# Patient Record
Sex: Female | Born: 1950 | Race: White | Hispanic: No | Smoking: Never smoker
Health system: Southern US, Community
[De-identification: ages and names within clinical notes are randomized; demographics above are authoritative.]

## PROBLEM LIST (undated history)

## (undated) DIAGNOSIS — E039 Hypothyroidism, unspecified: Secondary | ICD-10-CM

## (undated) DIAGNOSIS — K219 Gastro-esophageal reflux disease without esophagitis: Secondary | ICD-10-CM

## (undated) DIAGNOSIS — S129XXA Fracture of neck, unspecified, initial encounter: Secondary | ICD-10-CM

## (undated) DIAGNOSIS — F329 Major depressive disorder, single episode, unspecified: Secondary | ICD-10-CM

## (undated) DIAGNOSIS — S2220XA Unspecified fracture of sternum, initial encounter for closed fracture: Secondary | ICD-10-CM

## (undated) DIAGNOSIS — R0600 Dyspnea, unspecified: Secondary | ICD-10-CM

## (undated) DIAGNOSIS — S46119A Strain of muscle, fascia and tendon of long head of biceps, unspecified arm, initial encounter: Secondary | ICD-10-CM

## (undated) DIAGNOSIS — S22009A Unspecified fracture of unspecified thoracic vertebra, initial encounter for closed fracture: Secondary | ICD-10-CM

## (undated) DIAGNOSIS — R011 Cardiac murmur, unspecified: Secondary | ICD-10-CM

## (undated) DIAGNOSIS — D649 Anemia, unspecified: Secondary | ICD-10-CM

## (undated) DIAGNOSIS — Z85828 Personal history of other malignant neoplasm of skin: Secondary | ICD-10-CM

## (undated) DIAGNOSIS — S46319A Strain of muscle, fascia and tendon of triceps, unspecified arm, initial encounter: Secondary | ICD-10-CM

## (undated) DIAGNOSIS — F32A Depression, unspecified: Secondary | ICD-10-CM

## (undated) DIAGNOSIS — E079 Disorder of thyroid, unspecified: Secondary | ICD-10-CM

## (undated) DIAGNOSIS — H269 Unspecified cataract: Secondary | ICD-10-CM

## (undated) DIAGNOSIS — N189 Chronic kidney disease, unspecified: Secondary | ICD-10-CM

## (undated) DIAGNOSIS — M1990 Unspecified osteoarthritis, unspecified site: Secondary | ICD-10-CM

## (undated) HISTORY — DX: Depression, unspecified: F32.A

## (undated) HISTORY — PX: OTHER SURGICAL HISTORY: SHX169

## (undated) HISTORY — DX: Personal history of other malignant neoplasm of skin: Z85.828

## (undated) HISTORY — DX: Unspecified fracture of unspecified thoracic vertebra, initial encounter for closed fracture: S22.009A

## (undated) HISTORY — PX: FRACTURE SURGERY: SHX138

## (undated) HISTORY — DX: Cardiac murmur, unspecified: R01.1

## (undated) HISTORY — DX: Gastro-esophageal reflux disease without esophagitis: K21.9

## (undated) HISTORY — DX: Major depressive disorder, single episode, unspecified: F32.9

## (undated) HISTORY — DX: Unspecified fracture of sternum, initial encounter for closed fracture: S22.20XA

## (undated) HISTORY — PX: COLONOSCOPY: SHX174

## (undated) HISTORY — DX: Strain of muscle, fascia and tendon of long head of biceps, unspecified arm, initial encounter: S46.119A

## (undated) HISTORY — DX: Disorder of thyroid, unspecified: E07.9

## (undated) HISTORY — PX: JOINT REPLACEMENT: SHX530

## (undated) HISTORY — DX: Fracture of neck, unspecified, initial encounter: S12.9XXA

## (undated) HISTORY — DX: Strain of muscle, fascia and tendon of triceps, unspecified arm, initial encounter: S46.319A

## (undated) HISTORY — PX: ANTERIOR CRUCIATE LIGAMENT REPAIR: SHX115

## (undated) HISTORY — DX: Unspecified cataract: H26.9

## (undated) HISTORY — DX: Chronic kidney disease, unspecified: N18.9

---

## 1983-07-22 HISTORY — PX: FOOT SURGERY: SHX648

## 2004-05-09 ENCOUNTER — Other Ambulatory Visit: Admission: RE | Admit: 2004-05-09 | Discharge: 2004-05-09 | Payer: Self-pay | Admitting: Family Medicine

## 2004-07-21 DIAGNOSIS — S46319A Strain of muscle, fascia and tendon of triceps, unspecified arm, initial encounter: Secondary | ICD-10-CM

## 2004-07-21 DIAGNOSIS — S22009A Unspecified fracture of unspecified thoracic vertebra, initial encounter for closed fracture: Secondary | ICD-10-CM

## 2004-07-21 DIAGNOSIS — S2220XA Unspecified fracture of sternum, initial encounter for closed fracture: Secondary | ICD-10-CM

## 2004-07-21 HISTORY — DX: Unspecified fracture of sternum, initial encounter for closed fracture: S22.20XA

## 2004-07-21 HISTORY — DX: Strain of muscle, fascia and tendon of triceps, unspecified arm, initial encounter: S46.319A

## 2004-07-21 HISTORY — DX: Unspecified fracture of unspecified thoracic vertebra, initial encounter for closed fracture: S22.009A

## 2007-01-20 ENCOUNTER — Encounter: Admission: RE | Admit: 2007-01-20 | Discharge: 2007-01-20 | Payer: Self-pay | Admitting: Gastroenterology

## 2007-03-03 ENCOUNTER — Ambulatory Visit (HOSPITAL_COMMUNITY): Admission: RE | Admit: 2007-03-03 | Discharge: 2007-03-03 | Payer: Self-pay | Admitting: Gastroenterology

## 2007-03-03 ENCOUNTER — Encounter (INDEPENDENT_AMBULATORY_CARE_PROVIDER_SITE_OTHER): Payer: Self-pay | Admitting: Gastroenterology

## 2008-10-05 ENCOUNTER — Encounter: Admission: RE | Admit: 2008-10-05 | Discharge: 2008-10-05 | Payer: Self-pay | Admitting: Family Medicine

## 2010-12-03 NOTE — Op Note (Signed)
NAMEDAMITA, EPPARD                ACCOUNT NO.:  [id number]   MEDICAL RECORD NO.:  [id number]          PATIENT TYPE:  AMB   LOCATION:  ENDO                         FACILITY:  [facility]   PHYSICIAN:  Anselmo Rod, M.D.  DATE OF BIRTH:  1951/02/14   DATE OF PROCEDURE:  03/03/2007  DATE OF DISCHARGE:                               OPERATIVE REPORT   PROCEDURE PERFORMED:  Screening colonoscopy.   ENDOSCOPIST:  Anselmo Rod, M.D.   INSTRUMENT USED:  Pentax video colonoscope.   INDICATIONS FOR PROCEDURE:  A 60 year old white female undergoing  screening colonoscopy to rule out colonic polyps, masses, etc.   PREPROCEDURE PREPARATION:  Informed consent was procured from the  patient.  The patient was fasted for eight hours prior to the procedure  and prepped with a bottle of magnesium citrate and a gallon of NuLytely  the night prior to the procedure.  The risks and benefits of the  procedure including a 10% miss rate for cancer or polyps was discussed  with the patient as well.   PREPROCEDURE PHYSICAL:  The patient had stable vital signs.  Neck  supple.  Chest clear to auscultation.  S1 and S2 regular.  Abdomen soft  with normal bowel sounds.   DESCRIPTION OF PROCEDURE:  The patient was placed in left lateral  decubitus position and sedated with an additional 25 mcg of Fentanyl and  2 mg of Versed given intravenously in slow incremental doses. Once the  patient was adequately sedated and maintained on low flow oxygen and  continuous cardiac monitoring, the Pentax video colonoscope was advanced  from the rectum to the cecum.  The appendicular orifice and ileocecal  valve were clearly visualized and photographed.  Multiple washes were  done.  Terminal ileum appeared healthy without lesions, no masses,  polyps, erosions, ulcerations were seen.  A few early sigmoid  diverticula were noted.  The rest of the exam was unremarkable.  Retroflexion in the rectum revealed no abnormalities. The  patient  tolerated the procedure well without any complication.   IMPRESSION:  Normal colonoscopy up to the terminal ileum except for a  couple of early sigmoid diverticula.   RECOMMENDATIONS:  1. Continue a high fiber diet with liberal fluid intake.  2. Repeat colonoscopy in the next 10 years unless the patient has any      abnormal symptoms in the interim in which case, further      recommendations will be made.  3. Outpatient followup in the next two weeks for further      recommendations.      Anselmo Rod, M.D.  Electronically Signed     JNM/MEDQ  D:  03/03/2007  T:  03/04/2007  Job:  [id number]   cc:   Magnus Sinning. Rice, M.D.  Fax: [phone]   Chester Holstein. Earlene Plater, M.D.  Fax: [phone]

## 2010-12-03 NOTE — Op Note (Signed)
Kimberly [name], MAXFIELD                ACCOUNT NO.:  [id number]   MEDICAL RECORD NO.:  [id number]          PATIENT TYPE:  AMB   LOCATION:  ENDO                         FACILITY:  [facility]   PHYSICIAN:  Anselmo Rod, M.D.  DATE OF BIRTH:  June 21, 1951   DATE OF PROCEDURE:  03/03/2007  DATE OF DISCHARGE:                               OPERATIVE REPORT   PROCEDURE PERFORMED:  Esophagogastroduodenoscopy with multiple cold  biopsies.   ENDOSCOPIST:  Anselmo Rod, M.D.   INSTRUMENT USED:  Pentax video panendoscope.   INDICATIONS FOR PROCEDURE:  A 60 year old white female with history of  dysphagia undergoing EGD to rule out peptic esophagitis, stricture, etc.   PREPROCEDURE PREPARATION:  Informed consent was procured from the  patient.  The patient fasted for 8 hours prior to the procedure. Risks  and benefits of the procedure were discussed with the patient in great  detail.   PREPROCEDURE PHYSICAL:  The patient had stable vital signs. Neck supple.  Chest clear to auscultation. S1, S2 regular. Abdomen soft with normal  bowel sounds.   DESCRIPTION OF PROCEDURE:  The patient was placed in the left lateral  decubitus position and sedated with 75 mcg of Fentanyl and 8 mg of  Versed given intravenously in slow incremental doses. Once the patient  was adequately sedated and maintained on low-flow oxygen and continuous  cardiac monitoring, the Pentax video panendoscope was advanced through  the mouthpiece over the tongue into the esophagus under direct vision.  The entire esophagus was widely patent with no evidence of ring,  stricture, masses or esophagitis.  Small patches of pinkish mucosa above  the Z-line were biopsied.  Only two biopsies were done as there was  significant bleeding from the biopsy sites. The Z-line appeared healthy.  The scope was then advanced into the stomach.  Three small sessile  polyps were noted. A couple of these were biopsied for pathology. These  polyps were  predominantly in the high cardia, the rest of the gastric  mucosa appeared healthy except for a small antral erosion.  The proximal  small bowel appeared normal.  There was no obstruction. The patient  tolerated the procedure well without immediate complications.  There was  no evidence of a hiatal hernia on retroflexion or antegrade inspection.   IMPRESSION:  1. Two small patches of pinkish mucosa biopsied above the Z-line to      rule out Barrett's.  2. Otherwise healthy appearing esophagus with widely patent lumen, no      stricture, esophagitis or other masses noted.  3. Three small sessile polyps noted in the high cardia, biopsies done      for pathology.  4. Single antral erosions appreciated.  5. Normal proximal small bowel.  6. No evidence of a hiatal hernia.   RECOMMENDATIONS:  1. Await pathology results.  2. Avoid all nonsteroidals including aspirin for the next 4 weeks.  3. Proceed with a colonoscopy at this time.  4. Further recommendations to be made after colonoscopy has been done.      Anselmo Rod, M.D.  Electronically Signed  JNM/MEDQ  D:  03/03/2007  T:  03/04/2007  Job:  [id number]   cc:   Gerri Spore B. Earlene Plater, M.D.  Fax: [phone]   Magnus Sinning. Rice, M.D.  Fax: [phone]

## 2013-01-05 ENCOUNTER — Encounter: Payer: Self-pay | Admitting: Nurse Practitioner

## 2013-01-05 ENCOUNTER — Ambulatory Visit (INDEPENDENT_AMBULATORY_CARE_PROVIDER_SITE_OTHER): Admitting: Nurse Practitioner

## 2013-01-05 VITALS — BP 102/82 | Temp 97.5°F | Ht 64.0 in | Wt 180.0 lb

## 2013-01-05 DIAGNOSIS — F339 Major depressive disorder, recurrent, unspecified: Secondary | ICD-10-CM | POA: Insufficient documentation

## 2013-01-05 DIAGNOSIS — M79609 Pain in unspecified limb: Secondary | ICD-10-CM

## 2013-01-05 DIAGNOSIS — M79645 Pain in left finger(s): Secondary | ICD-10-CM

## 2013-01-05 DIAGNOSIS — L989 Disorder of the skin and subcutaneous tissue, unspecified: Secondary | ICD-10-CM

## 2013-01-05 NOTE — Patient Instructions (Signed)

## 2013-01-05 NOTE — Progress Notes (Signed)
  Subjective:    Patient ID: SAYDE LISH, female    DOB: [birth year], 62 y.o.   MRN: [id number]  HPI1. Patient here today c/o pain in left first knuckle- swells at times and hurts when she uses her fingers a lot. 2. Facial lesion hsa been there for awhile- not sure if she wants to see derm or not.   Review of Systems  Musculoskeletal: Positive for joint swelling.  All other systems reviewed and are negative.       Objective:   Physical Exam  Constitutional: She appears well-developed and well-nourished.  Cardiovascular: Normal rate and normal heart sounds.   Pulmonary/Chest: Effort normal and breath sounds normal.  Musculoskeletal:  From of left index finger with pain on full flexion- mild edema at distal metacarpal head.  Skin:  2cm annular macular lesion right cheek     BP 102/82  Temp(Src) 97.5 F (36.4 C) (Oral)  Ht 5\' 4"  (1.626 m)  Wt 180 lb (81.647 kg)  BMI 30.88 kg/m2      Assessment & Plan:  1. Finger pain, left Extra strenghth tylenol OTC Soak in moist heat when flares - Arthritis Panel  2. Facial lesion Patient refeuses derm referrall =will call if changes her mind  Mary-Margaret Daphine Deutscher, FNP

## 2013-01-06 LAB — ARTHRITIS PANEL
Anti Nuclear Antibody(ANA): NEGATIVE
Rheumatoid fact SerPl-aCnc: 10 [IU]/mL (ref <=14)
Sed Rate: 7 mm/hr (ref 0–22)
Uric Acid, Serum: 5.1 mg/dL (ref 2.4–6.0)

## 2013-04-18 ENCOUNTER — Ambulatory Visit (INDEPENDENT_AMBULATORY_CARE_PROVIDER_SITE_OTHER): Admitting: Family Medicine

## 2013-04-18 ENCOUNTER — Encounter: Payer: Self-pay | Admitting: Family Medicine

## 2013-04-18 VITALS — BP 92/60 | HR 65 | Temp 97.0°F | Ht 64.0 in | Wt 172.0 lb

## 2013-04-18 DIAGNOSIS — M26629 Arthralgia of temporomandibular joint, unspecified side: Secondary | ICD-10-CM

## 2013-04-18 MED ORDER — OMEPRAZOLE 20 MG PO CPDR: 20.0000 mg | DELAYED_RELEASE_CAPSULE | Freq: Every day | ORAL | Status: DC

## 2013-04-18 MED ORDER — NAPROXEN 500 MG PO TABS: 500.0000 mg | ORAL_TABLET | Freq: Two times a day (BID) | ORAL | Status: DC

## 2013-04-18 NOTE — Progress Notes (Signed)
  Subjective:    Patient ID: Kimberly [name], female    DOB: Sep 01, 1950, 62 y.o.   MRN: [id number]  HPI This 62 y.o. female presents for evaluation of jaw discomfort for a week.  She has  A hx of this and has been to see her dentist who has rx'd her a bite block but it didn't help. She states she has been taking motrin otc as directed.   Review of Systems C/o left tmj   No chest pain, SOB, HA, dizziness, vision change, N/V, diarrhea, constipation, dysuria, urinary urgency or frequency, myalgias, arthralgias or rash.  Objective:   Physical Exam Vital signs noted  Well developed well nourished female.  HEENT - Head atraumatic Normocephalic                Eyes - PERRLA, Conjuctiva - clear Sclera- Clear EOMI                Ears - EAC's Wnl TM's Wnl Gross Hearing WNL                Nose - Nares patent                 Throat - oropharanx wnl Respiratory - Lungs CTA bilateral Cardiac - RRR S1 and S2 without murmur MS - TTP left TMJ with crepitus.       Assessment & Plan:  TMJ arthralgia - Plan: naproxen (NAPROSYN) 500 MG tablet, omeprazole (PRILOSEC) 20 MG capsule Recommend using bite block given by dentist and if not better then follow up.  GERD - Prilosec 20mg  one po qd #30w/3

## 2013-04-18 NOTE — Patient Instructions (Addendum)
Temporomandibular Joint Pain  Your exam shows that you have a problem with your temporomandibular joint (TMJ), the joint that moves when you open your mouth or chew food. TMJ problems can result from direct injuries, bite abnormalities, or tension states which cause you to grind or clench your teeth. Typical symptoms include pain around the joint, clicking, restricted movement, and headaches.  The TMJ is like any other joint in the body; when it is strained, it needs rest to repair itself. To keep the joint at rest it is important that you do not open your mouth wider than the width of your index finger. If you must yawn, be sure to support your chin with your hand so your mouth does not open wide. Eat a soft diet (nothing firmer than ground beef, no raw vegetables), do not chew gum and do not talk if it causes you pain.  Apply topical heat by using a warm, moist cloth placed in front of the ear for 15 to 20 minutes several times daily. Alternating heat and ice may give even more relief. Anti-inflammatory pain medicine and muscle relaxants can also be helpful. A dental orthotic or splint may be used for temporary relief. Long-term problems may require treatment for stress as well as braces or surgery. Please check with your doctor or dentist if your symptoms do not improve within one week.  Document Released: 08/14/2004 Document Revised: 09/29/2011 Document Reviewed: 07/07/2005  [employer] Patient Information 2014 [employer]

## 2013-05-15 ENCOUNTER — Other Ambulatory Visit: Payer: Self-pay | Admitting: Family Medicine

## 2013-10-27 ENCOUNTER — Ambulatory Visit (INDEPENDENT_AMBULATORY_CARE_PROVIDER_SITE_OTHER): Admitting: Family Medicine

## 2013-10-27 ENCOUNTER — Ambulatory Visit (INDEPENDENT_AMBULATORY_CARE_PROVIDER_SITE_OTHER)

## 2013-10-27 VITALS — HR 84 | Temp 99.7°F | Ht 64.0 in | Wt 169.0 lb

## 2013-10-27 DIAGNOSIS — R059 Cough, unspecified: Secondary | ICD-10-CM

## 2013-10-27 DIAGNOSIS — J209 Acute bronchitis, unspecified: Secondary | ICD-10-CM

## 2013-10-27 DIAGNOSIS — R634 Abnormal weight loss: Secondary | ICD-10-CM

## 2013-10-27 DIAGNOSIS — R509 Fever, unspecified: Secondary | ICD-10-CM

## 2013-10-27 DIAGNOSIS — R05 Cough: Secondary | ICD-10-CM

## 2013-10-27 DIAGNOSIS — J029 Acute pharyngitis, unspecified: Secondary | ICD-10-CM

## 2013-10-27 DIAGNOSIS — R5381 Other malaise: Secondary | ICD-10-CM

## 2013-10-27 DIAGNOSIS — R5383 Other fatigue: Secondary | ICD-10-CM

## 2013-10-27 LAB — POCT CBC
Granulocyte percent: 70.7 %G (ref 37–80)
HCT, POC: 38.6 % (ref 37.7–47.9)
Hemoglobin: 12.7 g/dL (ref 12.2–16.2)
Lymph, poc: 1.4 (ref 0.6–3.4)
MCH, POC: 29.1 pg (ref 27–31.2)
MCHC: 32.8 g/dL (ref 31.8–35.4)
MCV: 88.9 fL (ref 80–97)
MPV: 7.2 fL (ref 0–99.8)
POC Granulocyte: 4.5 (ref 2–6.9)
POC LYMPH PERCENT: 22.1 %L (ref 10–50)
Platelet Count, POC: 163 10*3/uL (ref 142–424)
RBC: 4.4 M/uL (ref 4.04–5.48)
RDW, POC: 13.8 %
WBC: 6.4 10*3/uL (ref 4.6–10.2)

## 2013-10-27 LAB — POCT RAPID STREP A (OFFICE): Rapid Strep A Screen: POSITIVE — AB

## 2013-10-27 LAB — POCT INFLUENZA A/B
Influenza A, POC: NEGATIVE
Influenza B, POC: NEGATIVE

## 2013-10-27 MED ORDER — LEVALBUTEROL HCL 1.25 MG/3ML IN NEBU
1.2500 mg | INHALATION_SOLUTION | Freq: Once | RESPIRATORY_TRACT | Status: AC
  Administered 2013-10-27: 1.25 mg via RESPIRATORY_TRACT

## 2013-10-27 MED ORDER — HYDROCODONE-HOMATROPINE 5-1.5 MG/5ML PO SYRP: 5.0000 mL | ORAL_SOLUTION | Freq: Three times a day (TID) | ORAL | Status: DC | PRN

## 2013-10-27 MED ORDER — LEVOFLOXACIN 500 MG PO TABS: 500.0000 mg | ORAL_TABLET | Freq: Every day | ORAL | Status: DC

## 2013-10-27 MED ORDER — LEVALBUTEROL HCL 1.25 MG/0.5ML IN NEBU: 1.2500 mg | INHALATION_SOLUTION | Freq: Once | RESPIRATORY_TRACT | Status: DC

## 2013-10-27 MED ORDER — AMOXICILLIN-POT CLAVULANATE 875-125 MG PO TABS: 1.0000 | ORAL_TABLET | Freq: Two times a day (BID) | ORAL | Status: DC

## 2013-10-27 MED ORDER — ALBUTEROL SULFATE HFA 108 (90 BASE) MCG/ACT IN AERS: 2.0000 | INHALATION_SPRAY | Freq: Four times a day (QID) | RESPIRATORY_TRACT | Status: DC | PRN

## 2013-10-27 MED ORDER — METHYLPREDNISOLONE (PAK) 4 MG PO TABS: ORAL_TABLET | ORAL | Status: DC

## 2013-10-27 NOTE — Progress Notes (Signed)
Subjective:    Patient ID: Kimberly [name], female    DOB: 12-Aug-1950, 63 y.o.   MRN: [id number]  HPI This 63 y.o. female presents for evaluation of weakness, fatigue, URI, and cough.  She states  She never gets sick and she never goes to the doctor.  She c/o congestion in her central chest. She is not a smoker.  She has been coughing a lot and wheezing. She is sleeping a lot.  She was talking to her son who told her to go to the doctor.  She has been having weight loss, fever, malaise, Cough.   Review of Systems No chest pain, SOB, HA, dizziness, vision change, N/V, diarrhea, constipation, dysuria, urinary urgency or frequency, myalgias, arthralgias or rash.     Objective:   Physical Exam  Vital signs noted  Pale appearing female in NAD.  HEENT - Head atraumatic Normocephalic                Eyes - PERRLA, Conjuctiva - clear Sclera- Clear EOMI                Ears - EAC's Wnl TM's Wnl Gross Hearing WNL                Throat - oropharanx wnl Respiratory - Lungs with expiratory wheezes scattered Cardiac - RRR S1 and S2 without murmur GI - Abdomen soft Nontender and bowel sounds active x 4 Extremities - No edema. Neuro - Grossly intact.  Chest Xray - No infiltrates Lysbeth Penner FNP    Results for orders placed in visit on 10/27/13  POCT INFLUENZA A/B      Result Value Ref Range   Influenza A, POC Negative     Influenza B, POC Negative    POCT RAPID STREP A (OFFICE)      Result Value Ref Range   Rapid Strep A Screen Positive (*) Negative  POCT CBC      Result Value Ref Range   WBC 6.4  4.6 - 10.2 K/uL   Lymph, poc 1.4  0.6 - 3.4   POC LYMPH PERCENT 22.1  10 - 50 %L   POC Granulocyte 4.5  2 - 6.9   Granulocyte percent 70.7  37 - 80 %G   RBC 4.4  4.04 - 5.48 M/uL   Hemoglobin 12.7  12.2 - 16.2 g/dL   HCT, POC 38.6  37.7 - 47.9 %   MCV 88.9  80 - 97 fL   MCH, POC 29.1  27 - 31.2 pg   MCHC 32.8  31.8 - 35.4 g/dL   RDW, POC 13.8     Platelet Count, POC 163.0  142 -  424 K/uL   MPV 7.2  0 - 99.8 fL   Assessment & Plan:  Fever - Plan: POCT Influenza A/B, POCT rapid strep A, POCT CBC, DG Chest 2 View, POCT CBC, Sedimentation rate, methylPREDNIsolone (MEDROL DOSPACK) 4 MG tablet, levalbuterol (XOPENEX) nebulizer solution 1.25 mg, amoxicillin-clavulanate (AUGMENTIN) 875-125 MG per tablet, albuterol (PROVENTIL HFA;VENTOLIN HFA) 108 (90 BASE) MCG/ACT inhaler, DISCONTINUED: levofloxacin (LEVAQUIN) 500 MG tablet  Cough - Plan: POCT Influenza A/B, POCT rapid strep A, POCT CBC, DG Chest 2 View, POCT CBC, Sedimentation rate, methylPREDNIsolone (MEDROL DOSPACK) 4 MG tablet, levalbuterol (XOPENEX) nebulizer solution 1.25 mg, amoxicillin-clavulanate (AUGMENTIN) 875-125 MG per tablet, HYDROcodone-homatropine (HYCODAN) 5-1.5 MG/5ML syrup, DISCONTINUED: levofloxacin (LEVAQUIN) 500 MG tablet  Loss of weight - Plan: DG Chest 2 View, Sedimentation rate, methylPREDNIsolone (MEDROL DOSPACK) 4 MG tablet,  levalbuterol (XOPENEX) nebulizer solution 1.25 mg, amoxicillin-clavulanate (AUGMENTIN) 875-125 MG per tablet, DISCONTINUED: levofloxacin (LEVAQUIN) 500 MG tablet  Other malaise and fatigue - Plan: Vitamin B12, Vit D  25 hydroxy (rtn osteoporosis monitoring), Thyroid Panel With TSH, Sedimentation rate, methylPREDNIsolone (MEDROL DOSPACK) 4 MG tablet, levalbuterol (XOPENEX) nebulizer solution 1.25 mg, amoxicillin-clavulanate (AUGMENTIN) 875-125 MG per tablet, CANCELED: POCT SEDIMENTATION RATE, DISCONTINUED: levofloxacin (LEVAQUIN) 500 MG tablet  Acute bronchitis - Plan: methylPREDNIsolone (MEDROL DOSPACK) 4 MG tablet, levalbuterol (XOPENEX) nebulizer solution 1.25 mg, amoxicillin-clavulanate (AUGMENTIN) 875-125 MG per tablet, albuterol (PROVENTIL HFA;VENTOLIN HFA) 108 (90 BASE) MCG/ACT inhaler, HYDROcodone-homatropine (HYCODAN) 5-1.5 MG/5ML syrup, levalbuterol (XOPENEX) nebulizer solution 1.25 mg, DISCONTINUED: levofloxacin (LEVAQUIN) 500 MG tablet  Acute pharyngitis - Plan:  amoxicillin-clavulanate (AUGMENTIN) 875-125 MG per tablet po bid x 2 weeks #28  Follow up prn and follow up next week.  Lysbeth Penner FNP

## 2013-10-28 ENCOUNTER — Other Ambulatory Visit: Payer: Self-pay | Admitting: Family Medicine

## 2013-10-28 ENCOUNTER — Telehealth: Payer: Self-pay | Admitting: *Deleted

## 2013-10-28 LAB — THYROID PANEL WITH TSH
Free Thyroxine Index: 1.6 (ref 1.2–4.9)
T3 Uptake Ratio: 24 % (ref 24–39)
T4, Total: 6.8 ug/dL (ref 4.5–12.0)
TSH: 2.04 u[IU]/mL (ref 0.450–4.500)

## 2013-10-28 LAB — VITAMIN B12: Vitamin B-12: 1010 pg/mL — ABNORMAL HIGH (ref 211–946)

## 2013-10-28 LAB — SEDIMENTATION RATE: Sed Rate: 9 mm/hr (ref 0–40)

## 2013-10-28 LAB — VITAMIN D 25 HYDROXY (VIT D DEFICIENCY, FRACTURES): Vit D, 25-Hydroxy: 25.2 ng/mL — ABNORMAL LOW (ref 30.0–100.0)

## 2013-10-28 MED ORDER — VITAMIN D (ERGOCALCIFEROL) 1.25 MG (50000 UNIT) PO CAPS: 50000.0000 [IU] | ORAL_CAPSULE | ORAL | Status: DC

## 2013-10-28 NOTE — Telephone Encounter (Signed)
Message copied by Marin Olp on Fri Oct 28, 2013  1:38 PM ------      Message from: Lysbeth Penner      Created: Fri Oct 28, 2013 12:52 PM       Zigmund Daniel can you call her, I called her yesterday with the radiology interpretation, I did not mention a cd to pick up or know if we do this ------

## 2013-10-28 NOTE — Telephone Encounter (Signed)
Spoke with pt regarding CD Will have ready when pt comes to appt on Monday

## 2013-10-31 ENCOUNTER — Ambulatory Visit (INDEPENDENT_AMBULATORY_CARE_PROVIDER_SITE_OTHER): Admitting: Family Medicine

## 2013-10-31 ENCOUNTER — Encounter: Payer: Self-pay | Admitting: Family Medicine

## 2013-10-31 VITALS — BP 124/80 | HR 78 | Temp 97.1°F | Ht 64.0 in | Wt 168.8 lb

## 2013-10-31 DIAGNOSIS — R5383 Other fatigue: Secondary | ICD-10-CM

## 2013-10-31 DIAGNOSIS — R634 Abnormal weight loss: Secondary | ICD-10-CM

## 2013-10-31 DIAGNOSIS — J209 Acute bronchitis, unspecified: Secondary | ICD-10-CM

## 2013-10-31 DIAGNOSIS — R05 Cough: Secondary | ICD-10-CM

## 2013-10-31 DIAGNOSIS — R5381 Other malaise: Secondary | ICD-10-CM

## 2013-10-31 DIAGNOSIS — R059 Cough, unspecified: Secondary | ICD-10-CM

## 2013-10-31 DIAGNOSIS — R509 Fever, unspecified: Secondary | ICD-10-CM

## 2013-10-31 DIAGNOSIS — E559 Vitamin D deficiency, unspecified: Secondary | ICD-10-CM

## 2013-10-31 DIAGNOSIS — J029 Acute pharyngitis, unspecified: Secondary | ICD-10-CM

## 2013-10-31 MED ORDER — AMOXICILLIN-POT CLAVULANATE 875-125 MG PO TABS: 1.0000 | ORAL_TABLET | Freq: Two times a day (BID) | ORAL | Status: DC

## 2013-10-31 NOTE — Progress Notes (Signed)
   Subjective:    Patient ID: Kimberly [name], female    DOB: August 02, 1950, 63 y.o.   MRN: [id number]  HPI  This 63 y.o. female presents for evaluation of follow up on sore throat, weight loss, malaise And fatigue.  She has had labs cmp, cbc, tsh, sed rate, vit b12. Vit D, cxr, and influenza and Strep throat titres and the vit D was low, positive for gabs pharangitis, otherwise all other tests Were normal.  She was tx for bronchitis and strep throat and she has been giving some of the augmentin to her husband because he now has a uri and she would like a refill on the augmentin. She is doing a lot better now and feels better..  Review of Systems    No chest pain, SOB, HA, dizziness, vision change, N/V, diarrhea, constipation, dysuria, urinary urgency or frequency, myalgias, arthralgias or rash.  Objective:   Physical Exam  Vital signs noted  Well developed well nourished female.  HEENT - Head atraumatic Normocephalic                Eyes - PERRLA, Conjuctiva - clear Sclera- Clear EOMI                Ears - EAC's Wnl TM's Wnl Gross Hearing WNL                Nose - Nares patent                 Throat - oropharanx wnl Respiratory - Lungs CTA bilateral Cardiac - RRR S1 and S2 without murmur GI - Abdomen soft Nontender and bowel sounds active x 4 Extremities - No edema. Neuro - Grossly intact.      Assessment & Plan:  Fever - Plan: amoxicillin-clavulanate (AUGMENTIN) 875-125 MG per tablet - Resolved  Cough - Plan: amoxicillin-clavulanate (AUGMENTIN) 875-125 MG per tablet - Resolved  Loss of weight - Plan: amoxicillin-clavulanate (AUGMENTIN) 875-125 MG per tablet - Resolving  Other malaise and fatigue - Plan: amoxicillin-clavulanate (AUGMENTIN) 875-125 MG per tablet  Acute bronchitis - Plan: amoxicillin-clavulanate (AUGMENTIN) 875-125 MG per tablet - Resolving  Acute pharyngitis - Plan: amoxicillin-clavulanate (AUGMENTIN) 875-125 MG per tablet - Resolving  Unspecified vitamin  D deficiency - Plan: DG Bone Density And continue otc and rx meds  Discussed she get mammogram scheduled  Follow up prn and in 4 months  Lysbeth Penner FNP

## 2013-11-03 ENCOUNTER — Telehealth: Payer: Self-pay | Admitting: Family Medicine

## 2013-11-03 NOTE — Telephone Encounter (Signed)
I called [facility] and they do have RX. They said it was too early to fill until today. I  Notified pt and she verbalized understanding.

## 2013-12-21 ENCOUNTER — Ambulatory Visit (INDEPENDENT_AMBULATORY_CARE_PROVIDER_SITE_OTHER): Admitting: Pharmacist

## 2013-12-21 ENCOUNTER — Ambulatory Visit (INDEPENDENT_AMBULATORY_CARE_PROVIDER_SITE_OTHER)

## 2013-12-21 ENCOUNTER — Encounter: Payer: Self-pay | Admitting: Pharmacist

## 2013-12-21 VITALS — Ht 64.0 in | Wt 170.0 lb

## 2013-12-21 DIAGNOSIS — M8580 Other specified disorders of bone density and structure, unspecified site: Secondary | ICD-10-CM | POA: Insufficient documentation

## 2013-12-21 DIAGNOSIS — M899 Disorder of bone, unspecified: Secondary | ICD-10-CM

## 2013-12-21 DIAGNOSIS — E559 Vitamin D deficiency, unspecified: Secondary | ICD-10-CM

## 2013-12-21 DIAGNOSIS — M949 Disorder of cartilage, unspecified: Secondary | ICD-10-CM

## 2013-12-21 LAB — HM DEXA SCAN
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
## 2013-12-21 NOTE — Patient Instructions (Signed)
Exercise for Strong Bones  Exercise is important to build and maintain strong bones / bone density.  There are 2 types of exercises that are important to building and maintaining strong bones:  Weight- bearing and muscle-stregthening.  Weight-bearing Exercises  These exercises include activities that make you move against gravity while staying upright. Weight-bearing exercises can be high-impact or low-impact.  High-impact weight-bearing exercises help build bones and keep them strong. If you have broken a bone due to osteoporosis or are at risk of breaking a bone, you may need to avoid high-impact exercises. If you're not sure, you should check with your healthcare provider.  Examples of high-impact weight-bearing exercises are: Dancing  Doing high-impact aerobics  Hiking  Jogging/running  Jumping Rope  Stair climbing  Tennis  Low-impact weight-bearing exercises can also help keep bones strong and are a safe alternative if you cannot do high-impact exercises.   Examples of low-impact weight-bearing exercises are: Using elliptical training machines  Doing low-impact aerobics  Using stair-step machines  Fast walking on a treadmill or outside   Muscle-Strengthening Exercises These exercises include activities where you move your body, a weight or some other resistance against gravity. They are also known as resistance exercises and include: Lifting weights  Using elastic exercise bands  Using weight machines  Lifting your own body weight  Functional movements, such as standing and rising up on your toes  Yoga and Pilates can also improve strength, balance and flexibility. However, certain positions may not be safe for people with osteoporosis or those at increased risk of broken bones. For example, exercises that have you bend forward may increase the chance of breaking a bone in the spine.   Non-Impact Exercises There are other types of exercises that can help  prevent falls.  Non-impact exercises can help you to improve balance, posture and how well you move in everyday activities. Some of these exercises include: Balance exercises that strengthen your legs and test your balance, such as Tai Chi, can decrease your risk of falls.  Posture exercises that improve your posture and reduce rounded or "sloping" shoulders can help you decrease the chance of breaking a bone, especially in the spine.  Functional exercises that improve how well you move can help you with everyday activities and decrease your chance of falling and breaking a bone. For example, if you have trouble getting up from a chair or climbing stairs, you should do these activities as exercises.   **A physical therapist can teach you balance, posture and functional exercises. He/she can also help you learn which exercises are safe and appropriate for you.  Kimberly [name] has a physical therapy office in [place] in front of our office and referrals can be made for assessments and treatment as needed and strength and balance training.  If you would like to have an assessment with Kimberly [name] and our physical therapy team please let a nurse or provider know.      Fall Prevention and Home Safety Falls cause injuries and can affect all age groups. It is possible to prevent falls.  HOW TO PREVENT FALLS  Wear shoes with rubber soles that do not have an opening for your toes.  Keep the inside and outside of your house well lit.  Use night lights throughout your home.  Remove clutter from floors.  Clean up floor spills.  Remove throw rugs or fasten them to the floor with carpet tape.  Do not place electrical cords across pathways.  Put grab bars  by your tub, shower, and toilet. Do not use towel bars as grab bars.  Put handrails on both sides of the stairway. Fix loose handrails.  Do not climb on stools or stepladders, if possible.  Do not wax your floors.  Repair uneven or unsafe sidewalks,  walkways, or stairs.  Keep items you use a lot within reach.  Be aware of pets.  Keep emergency numbers next to the telephone.  Put smoke detectors in your home and near bedrooms. Ask your doctor what other things you can do to prevent falls. Document Released: 05/03/2009 Document Revised: 01/06/2012 Document Reviewed: 10/07/2011 [facility] Patient Information 2014 [place], [place].

## 2014-05-19 ENCOUNTER — Ambulatory Visit

## 2014-07-31 ENCOUNTER — Ambulatory Visit (INDEPENDENT_AMBULATORY_CARE_PROVIDER_SITE_OTHER): Admitting: Family

## 2014-07-31 ENCOUNTER — Encounter: Payer: Self-pay | Admitting: Family

## 2014-07-31 VITALS — BP 119/75 | HR 68 | Temp 97.1°F | Ht 64.0 in | Wt 182.0 lb

## 2014-07-31 DIAGNOSIS — J069 Acute upper respiratory infection, unspecified: Secondary | ICD-10-CM

## 2014-07-31 DIAGNOSIS — R059 Cough, unspecified: Secondary | ICD-10-CM

## 2014-07-31 DIAGNOSIS — R05 Cough: Secondary | ICD-10-CM

## 2014-07-31 MED ORDER — BENZONATATE 200 MG PO CAPS: 200.0000 mg | ORAL_CAPSULE | Freq: Three times a day (TID) | ORAL | Status: DC | PRN

## 2014-07-31 MED ORDER — AZITHROMYCIN 250 MG PO TABS: ORAL_TABLET | ORAL | Status: DC

## 2014-07-31 NOTE — Progress Notes (Signed)
Subjective:    Patient ID: Kimberly [name], female    DOB: [birth year], 64 y.o.   MRN: [id number]  Cough This is a new problem. The current episode started 1 to 4 weeks ago. The problem has been waxing and waning. The problem occurs every few minutes. The cough is productive of purulent sputum. Associated symptoms include wheezing. Pertinent negatives include no chills, ear congestion, ear pain, fever, headaches, myalgias, postnasal drip, rhinorrhea, sore throat or shortness of breath. The symptoms are aggravated by lying down and exercise. She has tried nothing for the symptoms. The treatment provided no relief. There is no history of asthma.      Review of Systems  Constitutional: Negative.  Negative for fever and chills.  HENT: Negative.  Negative for ear pain, postnasal drip, rhinorrhea and sore throat.   Eyes: Negative.   Respiratory: Positive for cough and wheezing. Negative for shortness of breath.   Cardiovascular: Negative.  Negative for palpitations.  Gastrointestinal: Negative.   Endocrine: Negative.   Genitourinary: Negative.   Musculoskeletal: Negative.  Negative for myalgias.  Neurological: Negative.  Negative for headaches.  Hematological: Negative.   Psychiatric/Behavioral: Negative.   All other systems reviewed and are negative.      Objective:   Physical Exam  Constitutional: She is oriented to person, place, and time. She appears well-developed and well-nourished. No distress.  HENT:  Head: Normocephalic and atraumatic.  Right Ear: External ear normal.  Left Ear: External ear normal.  Mouth/Throat: Oropharynx is clear and moist.  Nasal passage erythemas with mild swelling   Eyes: Pupils are equal, round, and reactive to light.  Cardiovascular: Normal rate, regular rhythm, normal heart sounds and intact distal pulses.   No murmur heard. Pulmonary/Chest: Effort normal and breath sounds normal. No respiratory distress. She has no wheezes.  Abdominal: Soft.  Bowel sounds are normal. She exhibits no distension. There is no tenderness.  Musculoskeletal: Normal range of motion. She exhibits no edema or tenderness.  Neurological: She is alert and oriented to person, place, and time. She has normal reflexes. No cranial nerve deficit.  Skin: Skin is warm and dry.  Psychiatric: She has a normal mood and affect. Her behavior is normal. Judgment and thought content normal.  Vitals reviewed.   BP 119/75 mmHg  Pulse 68  Temp(Src) 97.1 F (36.2 C) (Oral)  Ht 5\' 4"  (1.626 m)  Wt 182 lb (82.555 kg)  BMI 31.22 kg/m2       Assessment & Plan:  1. Cough - azithromycin (ZITHROMAX) 250 MG tablet; Take 500 mg once, then 250 mg for four days  Dispense: 6 tablet; Refill: 0 - benzonatate (TESSALON) 200 MG capsule; Take 1 capsule (200 mg total) by mouth 3 (three) times daily as needed.  Dispense: 30 capsule; Refill: 1  2. Acute upper respiratory infection -- Take meds as prescribed - Use a cool mist humidifier  -Use saline nose sprays frequently -Saline irrigations of the nose can be very helpful if done frequently.  * 4X daily for 1 week*  * Use of a nettie pot can be helpful with this. Follow directions with this* -Force fluids -For any cough or congestion  Use plain Mucinex- regular strength or max strength is fine   * Children- consult with Pharmacist for dosing -For fever or aces or pains- take tylenol or ibuprofen appropriate for age and weight.  * for fevers greater than 101 orally you may alternate ibuprofen and tylenol every  3 hours. -Throat lozenges if help -  azithromycin (ZITHROMAX) 250 MG tablet; Take 500 mg once, then 250 mg for four days  Dispense: 6 tablet; Refill: 0  Evelina Dun, FNP

## 2014-07-31 NOTE — Patient Instructions (Signed)
Upper Respiratory Infection, Adult An upper respiratory infection (URI) is also sometimes known as the common cold. The upper respiratory tract includes the nose, sinuses, throat, trachea, and bronchi. Bronchi are the airways leading to the lungs. Most people improve within 1 week, but symptoms can last up to 2 weeks. A residual cough may last even longer.  CAUSES Many different viruses can infect the tissues lining the upper respiratory tract. The tissues become irritated and inflamed and often become very moist. Mucus production is also common. A cold is contagious. You can easily spread the virus to others by oral contact. This includes kissing, sharing a glass, coughing, or sneezing. Touching your mouth or nose and then touching a surface, which is then touched by another person, can also spread the virus. SYMPTOMS  Symptoms typically develop 1 to 3 days after you come in contact with a cold virus. Symptoms vary from person to person. They may include:  Runny nose.  Sneezing.  Nasal congestion.  Sinus irritation.  Sore throat.  Loss of voice (laryngitis).  Cough.  Fatigue.  Muscle aches.  Loss of appetite.  Headache.  Low-grade fever. DIAGNOSIS  You might diagnose your own cold based on familiar symptoms, since most people get a cold 2 to 3 times a year. Your caregiver can confirm this based on your exam. Most importantly, your caregiver can check that your symptoms are not due to another disease such as strep throat, sinusitis, pneumonia, asthma, or epiglottitis. Blood tests, throat tests, and X-rays are not necessary to diagnose a common cold, but they may sometimes be helpful in excluding other more serious diseases. Your caregiver will decide if any further tests are required. RISKS AND COMPLICATIONS  You may be at risk for a more severe case of the common cold if you smoke cigarettes, have chronic heart disease (such as heart failure) or lung disease (such as asthma), or if  you have a weakened immune system. The very young and very old are also at risk for more serious infections. Bacterial sinusitis, middle ear infections, and bacterial pneumonia can complicate the common cold. The common cold can worsen asthma and chronic obstructive pulmonary disease (COPD). Sometimes, these complications can require emergency medical care and may be life-threatening. PREVENTION  The best way to protect against getting a cold is to practice good hygiene. Avoid oral or hand contact with people with cold symptoms. Wash your hands often if contact occurs. There is no clear evidence that vitamin C, vitamin E, echinacea, or exercise reduces the chance of developing a cold. However, it is always recommended to get plenty of rest and practice good nutrition. TREATMENT  Treatment is directed at relieving symptoms. There is no cure. Antibiotics are not effective, because the infection is caused by a virus, not by bacteria. Treatment may include:  Increased fluid intake. Sports drinks offer valuable electrolytes, sugars, and fluids.  Breathing heated mist or steam (vaporizer or shower).  Eating chicken soup or other clear broths, and maintaining good nutrition.  Getting plenty of rest.  Using gargles or lozenges for comfort.  Controlling fevers with ibuprofen or acetaminophen as directed by your caregiver.  Increasing usage of your inhaler if you have asthma. Zinc gel and zinc lozenges, taken in the first 24 hours of the common cold, can shorten the duration and lessen the severity of symptoms. Pain medicines may help with fever, muscle aches, and throat pain. A variety of non-prescription medicines are available to treat congestion and runny nose. Your caregiver   can make recommendations and may suggest nasal or lung inhalers for other symptoms.  HOME CARE INSTRUCTIONS   Only take over-the-counter or prescription medicines for pain, discomfort, or fever as directed by your  caregiver.  Use a warm mist humidifier or inhale steam from a shower to increase air moisture. This may keep secretions moist and make it easier to breathe.  Drink enough water and fluids to keep your urine clear or pale yellow.  Rest as needed.  Return to work when your temperature has returned to normal or as your caregiver advises. You may need to stay home longer to avoid infecting others. You can also use a face mask and careful hand washing to prevent spread of the virus. SEEK MEDICAL CARE IF:   After the first few days, you feel you are getting worse rather than better.  You need your caregiver's advice about medicines to control symptoms.  You develop chills, worsening shortness of breath, or brown or red sputum. These may be signs of pneumonia.  You develop yellow or brown nasal discharge or pain in the face, especially when you bend forward. These may be signs of sinusitis.  You develop a fever, swollen neck glands, pain with swallowing, or white areas in the back of your throat. These may be signs of strep throat. SEEK IMMEDIATE MEDICAL CARE IF:   You have a fever.  You develop severe or persistent headache, ear pain, sinus pain, or chest pain.  You develop wheezing, a prolonged cough, cough up blood, or have a change in your usual mucus (if you have chronic lung disease).  You develop sore muscles or a stiff neck. Document Released: 12/31/2000 Document Revised: 09/29/2011 Document Reviewed: 10/12/2013 Exit[employer] Patient Information 2015 [employer]. This information is not intended to replace advice given to you by your health care provider. Make sure you discuss any questions you have with your health care provider.  

## 2014-09-26 ENCOUNTER — Encounter: Payer: Self-pay | Admitting: Nurse Practitioner

## 2014-09-26 ENCOUNTER — Ambulatory Visit (INDEPENDENT_AMBULATORY_CARE_PROVIDER_SITE_OTHER): Admitting: Nurse Practitioner

## 2014-09-26 VITALS — BP 119/73 | HR 87 | Temp 98.0°F | Ht 64.0 in | Wt 184.6 lb

## 2014-09-26 DIAGNOSIS — J069 Acute upper respiratory infection, unspecified: Secondary | ICD-10-CM | POA: Diagnosis not present

## 2014-09-26 MED ORDER — AZITHROMYCIN 250 MG PO TABS: ORAL_TABLET | ORAL | Status: DC

## 2014-09-26 NOTE — Patient Instructions (Signed)

## 2014-09-26 NOTE — Progress Notes (Signed)
  Subjective:     Kimberly [name] is a 64 y.o. female who presents for evaluation of sinus pain. Symptoms include: congestion, cough, headaches, nasal congestion and post nasal drip. Onset of symptoms was 3 days ago. Symptoms have been gradually worsening since that time. Past history is significant for occasional episodes of bronchitis. Patient is a non-smoker.  The following portions of the patient's history were reviewed and updated as appropriate: allergies, current medications, past family history, past medical history, past social history, past surgical history and problem list.  Review of Systems Pertinent items are noted in HPI.   Objective:    General appearance: alert, cooperative and appears stated age Ears: normal TM's and external ear canals both ears Nose: Nares normal. Septum midline. Mucosa normal. No drainage or sinus tenderness. Throat: lips, mucosa, and tongue normal; teeth and gums normal Lungs: clear to auscultation bilaterally Heart: regular rate and rhythm, S1, S2 normal, no murmur, click, rub or gallop    Assessment:   Acute URI.    Plan:   1. URI (upper respiratory infection)    Meds ordered this encounter  Medications  . azithromycin (ZITHROMAX Z-PAK) 250 MG tablet    Sig: As directed    Dispense:  1 each    Refill:  0    Order Specific Question:  Supervising Provider    Answer:  Chipper Herb [[id number]]   1. Take meds as prescribed 2. Use a cool mist humidifier especially during the winter months and when heat has been humid. 3. Use saline nose sprays frequently 4. Saline irrigations of the nose can be very helpful if done frequently.  * 4X daily for 1 week*  * Use of a nettie pot can be helpful with this. Follow directions with this* 5. Drink plenty of fluids 6. Keep thermostat turn down low 7.For any cough or congestion  Use plain Mucinex- regular strength or max strength is fine   * Children- consult with Pharmacist for dosing 8. For fever or  aces or pains- take tylenol or ibuprofen appropriate for age and weight.  * for fevers greater than 101 orally you may alternate ibuprofen and tylenol every  3 hours.   Mary-Margaret Hassell Done, FNP

## 2014-10-26 ENCOUNTER — Encounter: Payer: Self-pay | Admitting: Physician Assistant

## 2014-10-26 ENCOUNTER — Encounter (INDEPENDENT_AMBULATORY_CARE_PROVIDER_SITE_OTHER): Payer: Self-pay

## 2014-10-26 ENCOUNTER — Ambulatory Visit (INDEPENDENT_AMBULATORY_CARE_PROVIDER_SITE_OTHER): Admitting: Physician Assistant

## 2014-10-26 ENCOUNTER — Ambulatory Visit (INDEPENDENT_AMBULATORY_CARE_PROVIDER_SITE_OTHER)

## 2014-10-26 VITALS — BP 108/70 | HR 78 | Temp 97.2°F | Ht 64.0 in | Wt 188.0 lb

## 2014-10-26 DIAGNOSIS — M25571 Pain in right ankle and joints of right foot: Secondary | ICD-10-CM

## 2014-10-26 DIAGNOSIS — M25471 Effusion, right ankle: Secondary | ICD-10-CM

## 2014-10-26 DIAGNOSIS — M25474 Effusion, right foot: Secondary | ICD-10-CM

## 2014-10-26 NOTE — Progress Notes (Signed)
   Subjective:    Patient ID: Kimberly [name], female    DOB: [birth year], 64 y.o.   MRN: [id number]  HPI 64 y/o female presents with c/o right medial and posterior ankle pain x 5 days ago. Did a lot of walking prior to beginning of episode on vacation in [place]. No trauma or h/o injury    Review of Systems  Musculoskeletal:       Right ankle pain and swelling        Objective:   Physical Exam  Constitutional: She is oriented to person, place, and time. She appears well-developed and well-nourished. No distress.  afebrile  Musculoskeletal: Normal range of motion. She exhibits edema (mild, more lateral, localized to the ankle) and tenderness (right lateral ankle).  Neurological: She is alert and oriented to person, place, and time. She has normal reflexes.  Skin: She is not diaphoretic. No erythema.  Not warm or suggestive of infection   Nursing note and vitals reviewed.         Assessment & Plan:  1. Right ankle pain: I feel that this is d/t overuse from her recent excessive walking on vacation. Advised RICE and anti-inflammatory. Instructions given to patient. Xray negative, however did not totally r/o osteomyelitis.Patients exam does not indicate osteomyelitis so  I will have her f/u in 4 weeks. However, if symptoms do not improve after a week, I will order an MRI if needed. If pain worsens, f/u prior to 4 weeks.

## 2014-10-26 NOTE — Patient Instructions (Signed)
2 Aleve in morning, 2 in evening for pain and inflammation     Ankle Sprain An ankle sprain is an injury to the strong, fibrous tissues (ligaments) that hold your ankle bones together.  HOME CARE   Put ice on your ankle for 1-2 days or as told by your doctor.  Put ice in a plastic bag.  Place a towel between your skin and the bag.  Leave the ice on for 15-20 minutes at a time, every 2 hours while you are awake.  Only take medicine as told by your doctor.  Raise (elevate) your injured ankle above the level of your heart as much as possible for 2-3 days.  Use crutches if your doctor tells you to. Slowly put your own weight on the affected ankle. Use the crutches until you can walk without pain.  If you have a plaster splint:  Do not rest it on anything harder than a pillow for 24 hours.  Do not put weight on it.  Do not get it wet.  Take it off to shower or bathe.  If given, use an elastic wrap or support stocking for support. Take the wrap off if your toes lose feeling (numb), tingle, or turn cold or blue.  If you have an air splint:  Add or let out air to make it comfortable.  Take it off at night and to shower and bathe.  Wiggle your toes and move your ankle up and down often while you are wearing it. GET HELP IF:  You have rapidly increasing bruising or puffiness (swelling).  Your toes feel very cold.  You lose feeling in your foot.  Your medicine does not help your pain. GET HELP RIGHT AWAY IF:   Your toes lose feeling (numb) or turn blue.  You have severe pain that is increasing. MAKE SURE YOU:   Understand these instructions.  Will watch your condition.  Will get help right away if you are not doing well or get worse. Document Released: 12/24/2007 Document Revised: 11/21/2013 Document Reviewed: 01/19/2012 [facility] Patient Information 2015 [place], [place]. This information is not intended to replace advice given to you by your health care provider.  Make sure you discuss any questions you have with your health care provider.

## 2014-10-30 ENCOUNTER — Telehealth: Payer: Self-pay | Admitting: Physician Assistant

## 2014-10-30 ENCOUNTER — Other Ambulatory Visit: Payer: Self-pay | Admitting: Physician Assistant

## 2014-10-30 DIAGNOSIS — M79671 Pain in right foot: Secondary | ICD-10-CM

## 2014-10-30 NOTE — Telephone Encounter (Signed)
Stp and she is worried that she has osteomyelitis and has looked it up and wants an MRI.

## 2014-11-01 NOTE — Telephone Encounter (Signed)
Order sent for MRI at [facility]. Pending.

## 2014-11-02 NOTE — Telephone Encounter (Signed)
Not having swelling, redness, or heat - area is better - wants order cancelled. --- done.

## 2015-01-25 ENCOUNTER — Encounter: Payer: Self-pay | Admitting: Physician Assistant

## 2015-01-25 ENCOUNTER — Ambulatory Visit (INDEPENDENT_AMBULATORY_CARE_PROVIDER_SITE_OTHER): Admitting: Physician Assistant

## 2015-01-25 VITALS — BP 118/74 | HR 62 | Temp 97.2°F | Ht 64.0 in | Wt 184.0 lb

## 2015-01-25 DIAGNOSIS — M545 Low back pain, unspecified: Secondary | ICD-10-CM

## 2015-01-25 MED ORDER — PREDNISONE 10 MG (21) PO TBPK: ORAL_TABLET | ORAL | Status: DC

## 2015-01-25 MED ORDER — CITALOPRAM HYDROBROMIDE 10 MG PO TABS: 10.0000 mg | ORAL_TABLET | Freq: Every day | ORAL | Status: DC

## 2015-01-25 MED ORDER — MELOXICAM 7.5 MG PO TABS: 7.5000 mg | ORAL_TABLET | Freq: Every day | ORAL | Status: DC

## 2015-01-25 MED ORDER — BACLOFEN 10 MG PO TABS: 10.0000 mg | ORAL_TABLET | Freq: Three times a day (TID) | ORAL | Status: DC

## 2015-01-25 NOTE — Progress Notes (Signed)
   Subjective:    Patient ID: Kimberly [name], female    DOB: 1950/08/29, 64 y.o.   MRN: [id number]  HPI 64 y/o female presents with c/o lower back pain x 2 weeks. Inintially started when she was in [place] visiting her son after she had rode in the car up there. She has been to the chiropractor twice with some relief. She states that the pain has decreased since she got home and she has not been taking the ibuprofen in 1 week. She took Aleve 1 tablet daily over the past weeks. Denie trauma    Review of Systems  Gastrointestinal: Negative for nausea, vomiting, diarrhea and constipation.  Endocrine: Negative for polyuria.  Genitourinary: Negative for frequency and hematuria.  Musculoskeletal: Positive for back pain (bilateral low back pain , constant, worse with standing, localized aching pain ).       Objective:   Physical Exam  Constitutional: She is oriented to person, place, and time. She appears well-developed and well-nourished. No distress.  Musculoskeletal: She exhibits no edema or tenderness.  Decreased forward flexion d/t pain   Neurological: She is alert and oriented to person, place, and time. She has normal reflexes. She displays normal reflexes. No cranial nerve deficit. Coordination normal.  Skin: She is not diaphoretic. No erythema.  Psychiatric: She has a normal mood and affect. Her behavior is normal. Judgment and thought content normal.  Nursing note and vitals reviewed.         Assessment & Plan:  1. Bilateral low back pain without sciatica  - predniSONE (STERAPRED UNI-PAK 21 TAB) 10 MG (21) TBPK tablet; 6 pills PO on day 1, 5 on day 2, 4 on day 3, 3 on day 4, 2 on day 5, 1 on day 6  Dispense: 21 tablet; Refill: 0 - baclofen (LIORESAL) 10 MG tablet; Take 1 tablet (10 mg total) by mouth 3 (three) times daily.  Dispense: 30 each; Refill: 0 - meloxicam (MOBIC) 7.5 MG tablet; Take 1 tablet (7.5 mg total) by mouth daily.  Dispense: 30 tablet; Refill: 0   Continue  all meds Labs pending Health Maintenance reviewed Diet and exercise encouraged RTO 2 weeks    A. Benjamin Stain PA-C

## 2015-01-25 NOTE — Patient Instructions (Signed)
Back Pain, Adult Low back pain is very common. About 1 in 5 people have back pain.The cause of low back pain is rarely dangerous. The pain often gets better over time.About half of people with a sudden onset of back pain feel better in just 2 weeks. About 8 in 10 people feel better by 6 weeks.  CAUSES Some common causes of back pain include:  Strain of the muscles or ligaments supporting the spine.  Wear and tear (degeneration) of the spinal discs.  Arthritis.  Direct injury to the back. DIAGNOSIS Most of the time, the direct cause of low back pain is not known.However, back pain can be treated effectively even when the exact cause of the pain is unknown.Answering your caregiver's questions about your overall health and symptoms is one of the most accurate ways to make sure the cause of your pain is not dangerous. If your caregiver needs more information, he or she may order lab work or imaging tests (X-rays or MRIs).However, even if imaging tests show changes in your back, this usually does not require surgery. HOME CARE INSTRUCTIONS For many people, back pain returns.Since low back pain is rarely dangerous, it is often a condition that people can learn to manageon their own.   Remain active. It is stressful on the back to sit or stand in one place. Do not sit, drive, or stand in one place for more than 30 minutes at a time. Take short walks on level surfaces as soon as pain allows.Try to increase the length of time you walk each day.  Do not stay in bed.Resting more than 1 or 2 days can delay your recovery.  Do not avoid exercise or work.Your body is made to move.It is not dangerous to be active, even though your back may hurt.Your back will likely heal faster if you return to being active before your pain is gone.  Pay attention to your body when you bend and lift. Many people have less discomfortwhen lifting if they bend their knees, keep the load close to their bodies,and  avoid twisting. Often, the most comfortable positions are those that put less stress on your recovering back.  Find a comfortable position to sleep. Use a firm mattress and lie on your side with your knees slightly bent. If you lie on your back, put a pillow under your knees.  Only take over-the-counter or prescription medicines as directed by your caregiver. Over-the-counter medicines to reduce pain and inflammation are often the most helpful.Your caregiver may prescribe muscle relaxant drugs.These medicines help dull your pain so you can more quickly return to your normal activities and healthy exercise.  Put ice on the injured area.  Put ice in a plastic bag.  Place a towel between your skin and the bag.  Leave the ice on for 15-20 minutes, 03-04 times a day for the first 2 to 3 days. After that, ice and heat may be alternated to reduce pain and spasms.  Ask your caregiver about trying back exercises and gentle massage. This may be of some benefit.  Avoid feeling anxious or stressed.Stress increases muscle tension and can worsen back pain.It is important to recognize when you are anxious or stressed and learn ways to manage it.Exercise is a great option. SEEK MEDICAL CARE IF:  You have pain that is not relieved with rest or medicine.  You have pain that does not improve in 1 week.  You have new symptoms.  You are generally not feeling well. SEEK   IMMEDIATE MEDICAL CARE IF:   You have pain that radiates from your back into your legs.  You develop new bowel or bladder control problems.  You have unusual weakness or numbness in your arms or legs.  You develop nausea or vomiting.  You develop abdominal pain.  You feel faint. Document Released: 07/07/2005 Document Revised: 01/06/2012 Document Reviewed: 11/08/2013 [employer] Patient Information 2015 [employer]. This information is not intended to replace advice given to you by your health care provider. Make sure you  discuss any questions you have with your health care provider.  

## 2015-02-15 ENCOUNTER — Encounter: Payer: Self-pay | Admitting: Physician Assistant

## 2015-02-15 ENCOUNTER — Ambulatory Visit (INDEPENDENT_AMBULATORY_CARE_PROVIDER_SITE_OTHER): Admitting: Physician Assistant

## 2015-02-15 VITALS — BP 104/66 | HR 73 | Temp 97.4°F | Ht 64.0 in | Wt 185.0 lb

## 2015-02-15 DIAGNOSIS — M545 Low back pain, unspecified: Secondary | ICD-10-CM

## 2015-02-15 MED ORDER — MELOXICAM 7.5 MG PO TABS: 7.5000 mg | ORAL_TABLET | Freq: Every day | ORAL | Status: DC

## 2015-02-15 NOTE — Progress Notes (Signed)
   Subjective:    Patient ID: Kimberly [name], female    DOB: 1951/04/15, 64 y.o.   MRN: [id number]  HPI 64 y/o female presents for  3 week follow up of bilateral low back pain without sciatica. She states that the pain is a lot better but not totally resolved. She recently went on vacation and slept on a different bed, which may have contributed to her increased pain last week. She is still taking Meloxicam and is concerned that the pain will recur when she goes to see her son in [place]. She is then leaving to go to [place].     Review of Systems  Constitutional: Negative.   HENT: Negative.   Eyes: Negative.   Respiratory: Negative.   Cardiovascular: Negative.   Gastrointestinal: Negative.   Endocrine: Negative.   Genitourinary: Negative.   Musculoskeletal: Positive for myalgias and back pain (bilateral low back pain ).  Skin: Negative.   Allergic/Immunologic: Negative.   Neurological: Negative.   Hematological: Negative.   Psychiatric/Behavioral: Negative.        Objective:   Physical Exam  Constitutional: She is oriented to person, place, and time. She appears well-developed and well-nourished. No distress.  Abdominal:  Obese   Musculoskeletal: Normal range of motion. She exhibits no edema or tenderness.  Neurological: She is alert and oriented to person, place, and time.  Skin: She is not diaphoretic.  Psychiatric: She has a normal mood and affect. Her behavior is normal. Judgment and thought content normal.  Nursing note and vitals reviewed.         Assessment & Plan:  1. Bilateral low back pain without sciatica - Advised patient to stop at least every 2 hours during her drive to [place] and [place] for stretching - No picking up anything over 10 lbs - instructions given on exercise and proper lifting techniques  - meloxicam (MOBIC) 7.5 MG tablet; Take 1 tablet (7.5 mg total) by mouth daily.  Dispense: 30 tablet; Refill: 0 - may keep on hand to take if pain flares  after riding but do not take on a daily basis   RTC prn    A. Benjamin Stain PA-C

## 2015-02-15 NOTE — Patient Instructions (Signed)
Low Back Sprain with Rehab  A sprain is an injury in which a ligament is torn. The ligaments of the lower back are vulnerable to sprains. However, they are strong and require great force to be injured. These ligaments are important for stabilizing the spinal column. Sprains are classified into three categories. Grade 1 sprains cause pain, but the tendon is not lengthened. Grade 2 sprains include a lengthened ligament, due to the ligament being stretched or partially ruptured. With grade 2 sprains there is still function, although the function may be decreased. Grade 3 sprains involve a complete tear of the tendon or muscle, and function is usually impaired. SYMPTOMS   Severe pain in the lower back.  Sometimes, a feeling of a "pop," "snap," or tear, at the time of injury.  Tenderness and sometimes swelling at the injury site.  Uncommonly, bruising (contusion) within 48 hours of injury.  Muscle spasms in the back. CAUSES  Low back sprains occur when a force is placed on the ligaments that is greater than they can handle. Common causes of injury include:  Performing a stressful act while off-balance.  Repetitive stressful activities that involve movement of the lower back.  Direct hit (trauma) to the lower back. RISK INCREASES WITH:  Contact sports (football, wrestling).  Collisions (major skiing accidents).  Sports that require throwing or lifting (baseball, weightlifting).  Sports involving twisting of the spine (gymnastics, diving, tennis, golf).  Poor strength and flexibility.  Inadequate protection.  Previous back injury or surgery (especially fusion). PREVENTION  Wear properly fitted and padded protective equipment.  Warm up and stretch properly before activity.  Allow for adequate recovery between workouts.  Maintain physical fitness:  Strength, flexibility, and endurance.  Cardiovascular fitness.  Maintain a healthy body weight. PROGNOSIS  If treated  properly, low back sprains usually heal with non-surgical treatment. The length of time for healing depends on the severity of the injury.  RELATED COMPLICATIONS   Recurring symptoms, resulting in a chronic problem.  Chronic inflammation and pain in the low back.  Delayed healing or resolution of symptoms, especially if activity is resumed too soon.  Prolonged impairment.  Unstable or arthritic joints of the low back. TREATMENT  Treatment first involves the use of ice and medicine, to reduce pain and inflammation. The use of strengthening and stretching exercises may help reduce pain with activity. These exercises may be performed at home or with a therapist. Severe injuries may require referral to a therapist for further evaluation and treatment, such as ultrasound. Your caregiver may advise that you wear a back brace or corset, to help reduce pain and discomfort. Often, prolonged bed rest results in greater harm then benefit. Corticosteroid injections may be recommended. However, these should be reserved for the most serious cases. It is important to avoid using your back when lifting objects. At night, sleep on your back on a firm mattress, with a pillow placed under your knees. If non-surgical treatment is unsuccessful, surgery may be needed.  MEDICATION   If pain medicine is needed, nonsteroidal anti-inflammatory medicines (aspirin and ibuprofen), or other minor pain relievers (acetaminophen), are often advised.  Do not take pain medicine for 7 days before surgery.  Prescription pain relievers may be given, if your caregiver thinks they are needed. Use only as directed and only as much as you need.  Ointments applied to the skin may be helpful.  Corticosteroid injections may be given by your caregiver. These injections should be reserved for the most serious cases,   because they may only be given a certain number of times. HEAT AND COLD  Cold treatment (icing) should be applied for 10  to 15 minutes every 2 to 3 hours for inflammation and pain, and immediately after activity that aggravates your symptoms. Use ice packs or an ice massage.  Heat treatment may be used before performing stretching and strengthening activities prescribed by your caregiver, physical therapist, or athletic trainer. Use a heat pack or a warm water soak. SEEK MEDICAL CARE IF:   Symptoms get worse or do not improve in 2 to 4 weeks, despite treatment.  You develop numbness or weakness in either leg.  You lose bowel or bladder function.  Any of the following occur after surgery: fever, increased pain, swelling, redness, drainage of fluids, or bleeding in the affected area.  New, unexplained symptoms develop. (Drugs used in treatment may produce side effects.) EXERCISES  RANGE OF MOTION (ROM) AND STRETCHING EXERCISES - Low Back Sprain Most people with lower back pain will find that their symptoms get worse with excessive bending forward (flexion) or arching at the lower back (extension). The exercises that will help resolve your symptoms will focus on the opposite motion.  Your physician, physical therapist or athletic trainer will help you determine which exercises will be most helpful to resolve your lower back pain. Do not complete any exercises without first consulting with your caregiver. Discontinue any exercises which make your symptoms worse, until you speak to your caregiver. If you have pain, numbness or tingling which travels down into your buttocks, leg or foot, the goal of the therapy is for these symptoms to move closer to your back and eventually resolve. Sometimes, these leg symptoms will get better, but your lower back pain may worsen. This is often an indication of progress in your rehabilitation. Be very alert to any changes in your symptoms and the activities in which you participated in the 24 hours prior to the change. Sharing this information with your caregiver will allow him or her to  most efficiently treat your condition. These exercises may help you when beginning to rehabilitate your injury. Your symptoms may resolve with or without further involvement from your physician, physical therapist or athletic trainer. While completing these exercises, remember:   Restoring tissue flexibility helps normal motion to return to the joints. This allows healthier, less painful movement and activity.  An effective stretch should be held for at least 30 seconds.  A stretch should never be painful. You should only feel a gentle lengthening or release in the stretched tissue. FLEXION RANGE OF MOTION AND STRETCHING EXERCISES: STRETCH - Flexion, Single Knee to Chest   Lie on a firm bed or floor with both legs extended in front of you.  Keeping one leg in contact with the floor, bring your opposite knee to your chest. Hold your leg in place by either grabbing behind your thigh or at your knee.  Pull until you feel a gentle stretch in your low back. Hold __________ seconds.  Slowly release your grasp and repeat the exercise with the opposite side. Repeat __________ times. Complete this exercise __________ times per day.  STRETCH - Flexion, Double Knee to Chest  Lie on a firm bed or floor with both legs extended in front of you.  Keeping one leg in contact with the floor, bring your opposite knee to your chest.  Tense your stomach muscles to support your back and then lift your other knee to your chest. Hold your legs   in place by either grabbing behind your thighs or at your knees.  Pull both knees toward your chest until you feel a gentle stretch in your low back. Hold __________ seconds.  Tense your stomach muscles and slowly return one leg at a time to the floor. Repeat __________ times. Complete this exercise __________ times per day.  STRETCH - Low Trunk Rotation  Lie on a firm bed or floor. Keeping your legs in front of you, bend your knees so they are both pointed toward the  ceiling and your feet are flat on the floor.  Extend your arms out to the side. This will stabilize your upper body by keeping your shoulders in contact with the floor.  Gently and slowly drop both knees together to one side until you feel a gentle stretch in your low back. Hold for __________ seconds.  Tense your stomach muscles to support your lower back as you bring your knees back to the starting position. Repeat the exercise to the other side. Repeat __________ times. Complete this exercise __________ times per day  EXTENSION RANGE OF MOTION AND FLEXIBILITY EXERCISES: STRETCH - Extension, Prone on Elbows   Lie on your stomach on the floor, a bed will be too soft. Place your palms about shoulder width apart and at the height of your head.  Place your elbows under your shoulders. If this is too painful, stack pillows under your chest.  Allow your body to relax so that your hips drop lower and make contact more completely with the floor.  Hold this position for __________ seconds.  Slowly return to lying flat on the floor. Repeat __________ times. Complete this exercise __________ times per day.  RANGE OF MOTION - Extension, Prone Press Ups  Lie on your stomach on the floor, a bed will be too soft. Place your palms about shoulder width apart and at the height of your head.  Keeping your back as relaxed as possible, slowly straighten your elbows while keeping your hips on the floor. You may adjust the placement of your hands to maximize your comfort. As you gain motion, your hands will come more underneath your shoulders.  Hold this position __________ seconds.  Slowly return to lying flat on the floor. Repeat __________ times. Complete this exercise __________ times per day.  RANGE OF MOTION- Quadruped, Neutral Spine   Assume a hands and knees position on a firm surface. Keep your hands under your shoulders and your knees under your hips. You may place padding under your knees for  comfort.  Drop your head and point your tailbone toward the ground below you. This will round out your lower back like an angry cat. Hold this position for __________ seconds.  Slowly lift your head and release your tail bone so that your back sags into a large arch, like an old horse.  Hold this position for __________ seconds.  Repeat this until you feel limber in your low back.  Now, find your "sweet spot." This will be the most comfortable position somewhere between the two previous positions. This is your neutral spine. Once you have found this position, tense your stomach muscles to support your low back.  Hold this position for __________ seconds. Repeat __________ times. Complete this exercise __________ times per day.  STRENGTHENING EXERCISES - Low Back Sprain These exercises may help you when beginning to rehabilitate your injury. These exercises should be done near your "sweet spot." This is the neutral, low-back arch, somewhere between fully rounded   and fully arched, that is your least painful position. When performed in this safe range of motion, these exercises can be used for people who have either a flexion or extension based injury. These exercises may resolve your symptoms with or without further involvement from your physician, physical therapist or athletic trainer. While completing these exercises, remember:   Muscles can gain both the endurance and the strength needed for everyday activities through controlled exercises.  Complete these exercises as instructed by your physician, physical therapist or athletic trainer. Increase the resistance and repetitions only as guided.  You may experience muscle soreness or fatigue, but the pain or discomfort you are trying to eliminate should never worsen during these exercises. If this pain does worsen, stop and make certain you are following the directions exactly. If the pain is still present after adjustments, discontinue the  exercise until you can discuss the trouble with your caregiver. STRENGTHENING - Deep Abdominals, Pelvic Tilt   Lie on a firm bed or floor. Keeping your legs in front of you, bend your knees so they are both pointed toward the ceiling and your feet are flat on the floor.  Tense your lower abdominal muscles to press your low back into the floor. This motion will rotate your pelvis so that your tail bone is scooping upwards rather than pointing at your feet or into the floor. With a gentle tension and even breathing, hold this position for __________ seconds. Repeat __________ times. Complete this exercise __________ times per day.  STRENGTHENING - Abdominals, Crunches   Lie on a firm bed or floor. Keeping your legs in front of you, bend your knees so they are both pointed toward the ceiling and your feet are flat on the floor. Cross your arms over your chest.  Slightly tip your chin down without bending your neck.  Tense your abdominals and slowly lift your trunk high enough to just clear your shoulder blades. Lifting higher can put excessive stress on the lower back and does not further strengthen your abdominal muscles.  Control your return to the starting position. Repeat __________ times. Complete this exercise __________ times per day.  STRENGTHENING - Quadruped, Opposite UE/LE Lift   Assume a hands and knees position on a firm surface. Keep your hands under your shoulders and your knees under your hips. You may place padding under your knees for comfort.  Find your neutral spine and gently tense your abdominal muscles so that you can maintain this position. Your shoulders and hips should form a rectangle that is parallel with the floor and is not twisted.  Keeping your trunk steady, lift your right hand no higher than your shoulder and then your left leg no higher than your hip. Make sure you are not holding your breath. Hold this position for __________ seconds.  Continuing to keep  your abdominal muscles tense and your back steady, slowly return to your starting position. Repeat with the opposite arm and leg. Repeat __________ times. Complete this exercise __________ times per day.  STRENGTHENING - Abdominals and Quadriceps, Straight Leg Raise   Lie on a firm bed or floor with both legs extended in front of you.  Keeping one leg in contact with the floor, bend the other knee so that your foot can rest flat on the floor.  Find your neutral spine, and tense your abdominal muscles to maintain your spinal position throughout the exercise.  Slowly lift your straight leg off the floor about 6 inches for a count   of 15, making sure to not hold your breath.  Still keeping your neutral spine, slowly lower your leg all the way to the floor. Repeat this exercise with each leg __________ times. Complete this exercise __________ times per day. POSTURE AND BODY MECHANICS CONSIDERATIONS - Low Back Sprain Keeping correct posture when sitting, standing or completing your activities will reduce the stress put on different body tissues, allowing injured tissues a chance to heal and limiting painful experiences. The following are general guidelines for improved posture. Your physician or physical therapist will provide you with any instructions specific to your needs. While reading these guidelines, remember:  The exercises prescribed by your provider will help you have the flexibility and strength to maintain correct postures.  The correct posture provides the best environment for your joints to work. All of your joints have less wear and tear when properly supported by a spine with good posture. This means you will experience a healthier, less painful body.  Correct posture must be practiced with all of your activities, especially prolonged sitting and standing. Correct posture is as important when doing repetitive low-stress activities (typing) as it is when doing a single heavy-load  activity (lifting). RESTING POSITIONS Consider which positions are most painful for you when choosing a resting position. If you have pain with flexion-based activities (sitting, bending, stooping, squatting), choose a position that allows you to rest in a less flexed posture. You would want to avoid curling into a fetal position on your side. If your pain worsens with extension-based activities (prolonged standing, working overhead), avoid resting in an extended position such as sleeping on your stomach. Most people will find more comfort when they rest with their spine in a more neutral position, neither too rounded nor too arched. Lying on a non-sagging bed on your side with a pillow between your knees, or on your back with a pillow under your knees will often provide some relief. Keep in mind, being in any one position for a prolonged period of time, no matter how correct your posture, can still lead to stiffness. PROPER SITTING POSTURE In order to minimize stress and discomfort on your spine, you must sit with correct posture. Sitting with good posture should be effortless for a healthy body. Returning to good posture is a gradual process. Many people can work toward this most comfortably by using various supports until they have the flexibility and strength to maintain this posture on their own. When sitting with proper posture, your ears will fall over your shoulders and your shoulders will fall over your hips. You should use the back of the chair to support your upper back. Your lower back will be in a neutral position, just slightly arched. You may place a small pillow or folded towel at the base of your lower back for  support.  When working at a desk, create an environment that supports good, upright posture. Without extra support, muscles tire, which leads to excessive strain on joints and other tissues. Keep these recommendations in mind: CHAIR:  A chair should be able to slide under your desk  when your back makes contact with the back of the chair. This allows you to work closely.  The chair's height should allow your eyes to be level with the upper part of your monitor and your hands to be slightly lower than your elbows. BODY POSITION  Your feet should make contact with the floor. If this is not possible, use a foot rest.  Keep your   ears over your shoulders. This will reduce stress on your neck and low back. INCORRECT SITTING POSTURES  If you are feeling tired and unable to assume a healthy sitting posture, do not slouch or slump. This puts excessive strain on your back tissues, causing more damage and pain. Healthier options include:  Using more support, like a lumbar pillow.  Switching tasks to something that requires you to be upright or walking.  Talking a brief walk.  Lying down to rest in a neutral-spine position. PROLONGED STANDING WHILE SLIGHTLY LEANING FORWARD  When completing a task that requires you to lean forward while standing in one place for a long time, place either foot up on a stationary 2-4 inch high object to help maintain the best posture. When both feet are on the ground, the lower back tends to lose its slight inward curve. If this curve flattens (or becomes too large), then the back and your other joints will experience too much stress, tire more quickly, and can cause pain. CORRECT STANDING POSTURES Proper standing posture should be assumed with all daily activities, even if they only take a few moments, like when brushing your teeth. As in sitting, your ears should fall over your shoulders and your shoulders should fall over your hips. You should keep a slight tension in your abdominal muscles to brace your spine. Your tailbone should point down to the ground, not behind your body, resulting in an over-extended swayback posture.  INCORRECT STANDING POSTURES  Common incorrect standing postures include a forward head, locked knees and/or an excessive  swayback. WALKING Walk with an upright posture. Your ears, shoulders and hips should all line-up. PROLONGED ACTIVITY IN A FLEXED POSITION When completing a task that requires you to bend forward at your waist or lean over a low surface, try to find a way to stabilize 3 out of 4 of your limbs. You can place a hand or elbow on your thigh or rest a knee on the surface you are reaching across. This will provide you more stability, so that your muscles do not tire as quickly. By keeping your knees relaxed, or slightly bent, you will also reduce stress across your lower back. CORRECT LIFTING TECHNIQUES DO :  Assume a wide stance. This will provide you more stability and the opportunity to get as close as possible to the object which you are lifting.  Tense your abdominals to brace your spine. Bend at the knees and hips. Keeping your back locked in a neutral-spine position, lift using your leg muscles. Lift with your legs, keeping your back straight.  Test the weight of unknown objects before attempting to lift them.  Try to keep your elbows locked down at your sides in order get the best strength from your shoulders when carrying an object.  Always ask for help when lifting heavy or awkward objects. INCORRECT LIFTING TECHNIQUES DO NOT:   Lock your knees when lifting, even if it is a small object.  Bend and twist. Pivot at your feet or move your feet when needing to change directions.  Assume that you can safely pick up even a paperclip without proper posture. Document Released: 07/07/2005 Document Revised: 09/29/2011 Document Reviewed: 10/19/2008 [facility] Patient Information 2015 [place], [place]. This information is not intended to replace advice given to you by your health care provider. Make sure you discuss any questions you have with your health care provider. Back Exercises Back exercises help treat and prevent back injuries. The goal is to increase your  strength in your belly (abdominal)  and back muscles. These exercises can also help with flexibility. Start these exercises when told by your doctor. HOME CARE Back exercises include: Pelvic Tilt.  Lie on your back with your knees bent. Tilt your pelvis until the lower part of your back is against the floor. Hold this position 5 to 10 sec. Repeat this exercise 5 to 10 times. Knee to Chest.  Pull 1 knee up against your chest and hold for 20 to 30 seconds. Repeat this with the other knee. This may be done with the other leg straight or bent, whichever feels better. Then, pull both knees up against your chest. Sit-Ups or Curl-Ups.  Bend your knees 90 degrees. Start with tilting your pelvis, and do a partial, slow sit-up. Only lift your upper half 30 to 45 degrees off the floor. Take at least 2 to 3 seonds for each sit-up. Do not do sit-ups with your knees out straight. If partial sit-ups are difficult, simply do the above but with only tightening your belly (abdominal) muscles and holding it as told. Hip-Lift.  Lie on your back with your knees flexed 90 degrees. Push down with your feet and shoulders as you raise your hips 2 inches off the floor. Hold for 10 seconds, repeat 5 to 10 times. Back Arches.  Lie on your stomach. Prop yourself up on bent elbows. Slowly press on your hands, causing an arch in your low back. Repeat 3 to 5 times. Shoulder-Lifts.  Lie face down with arms beside your body. Keep hips and belly pressed to floor as you slowly lift your head and shoulders off the floor. Do not overdo your exercises. Be careful in the beginning. Exercises may cause you some mild back discomfort. If the pain lasts for more than 15 minutes, stop the exercises until you see your doctor. Improvement with exercise for back problems is slow.  Document Released: 08/09/2010 Document Revised: 09/29/2011 Document Reviewed: 05/08/2011 [facility] Patient Information 2015 [place], [place]. This information is not intended to replace advice given  to you by your health care provider. Make sure you discuss any questions you have with your health care provider. Back Injury Prevention The following tips can help you to prevent a back injury. PHYSICAL FITNESS  Exercise often. Try to develop strong stomach (abdominal) muscles.  Do aerobic exercises often. This includes walking, jogging, biking, swimming.  Do exercises that help with balance and strength often. This includes tai chi and yoga.  Stretch before and after you exercise.  Keep a healthy weight. DIET   Ask your doctor how much calcium and vitamin D you need every day.  Include calcium in your diet. Foods high in calcium include dairy products; green, leafy vegetables; and products with calcium added (fortified).  Include vitamin D in your diet. Foods high in vitamin D include milk and products with vitamin D added.  Think about taking a multivitamin or other nutritional products called " supplements."  Stop smoking if you smoke. POSTURE   Sit and stand up straight. Avoid leaning forward or hunching over.  Choose chairs that support your lower back.  If you work at a desk:  Sit close to your work so you do not lean over.  Keep your chin tucked in.  Keep your neck drawn back.  Keep your elbows bent at a right angle. Your arms should look like the letter "L."  Sit high and close to the steering wheel when you drive. Add low back support  to your car seat if needed.  Avoid sitting or standing in one position for too long. Get up and move around every hour. Take breaks if you are driving for a long time.  Sleep on your side with your knees slightly bent. You can also sleep on your back with a pillow under your knees. Do not sleep on your stomach. LIFTING, TWISTING, AND REACHING  Avoid heavy lifting, especially lifting over and over again. If you must do heavy lifting:  Stretch before lifting.  Work slowly.  Rest between lifts.  Use carts and dollies to  move objects when possible.  Make several small trips instead of carrying 1 heavy load.  Ask for help when you need it.  Ask for help when moving big, awkward objects.  Follow these steps when lifting:  Stand with your feet shoulder-width apart.  Get as close to the object as you can. Do not pick up heavy objects that are far from your body.  Use handles or lifting straps when possible.  Bend at your knees. Squat down, but keep your heels off the floor.  Keep your shoulders back, your chin tucked in, and your back straight.  Lift the object slowly. Tighten the muscles in your legs, stomach, and butt. Keep the object as close to the center of your body as possible.  Reverse these directions when you put a load down.  Do not:  Lift the object above your waist.  Twist at the waist while lifting or carrying a load. Move your feet if you need to turn, not your waist.  Bend over without bending at your knees.  Avoid reaching over your head, across a table, or for an object on a high surface. OTHER TIPS  Avoid wet floors and keep sidewalks clear of ice.  Do not sleep on a mattress that is too soft or too hard.  Keep items that you use often within easy reach.  Put heavier objects on shelves at waist level. Put lighter objects on lower or higher shelves.  Find ways to lessen your stress. You can try exercise, massage, or relaxation.  Get help for depression or anxiety if needed. GET HELP IF:  You injure your back.  You have questions about diet, exercise, or other ways to prevent back injuries. MAKE SURE YOU:  Understand these instructions.  Will watch your condition.  Will get help right away if you are not doing well or get worse. Document Released: 12/24/2007 Document Revised: 09/29/2011 Document Reviewed: 08/18/2011 [facility] Patient Information 2015 [place], [place]. This information is not intended to replace advice given to you by your health care provider.  Make sure you discuss any questions you have with your health care provider.

## 2015-02-17 ENCOUNTER — Telehealth: Payer: Self-pay | Admitting: Physician Assistant

## 2015-02-19 NOTE — Telephone Encounter (Signed)
Patients husband aware that meloxicam has been sent to pharmacy.

## 2015-02-21 ENCOUNTER — Other Ambulatory Visit: Payer: Self-pay | Admitting: Physician Assistant

## 2015-03-07 ENCOUNTER — Encounter: Payer: Self-pay | Admitting: Family Medicine

## 2015-03-07 ENCOUNTER — Ambulatory Visit (INDEPENDENT_AMBULATORY_CARE_PROVIDER_SITE_OTHER): Admitting: Family Medicine

## 2015-03-07 VITALS — BP 110/78 | HR 85 | Ht 64.0 in | Wt 184.0 lb

## 2015-03-07 DIAGNOSIS — M545 Low back pain, unspecified: Secondary | ICD-10-CM | POA: Insufficient documentation

## 2015-03-07 MED ORDER — BACLOFEN 10 MG PO TABS: 10.0000 mg | ORAL_TABLET | Freq: Three times a day (TID) | ORAL | Status: DC

## 2015-03-07 NOTE — Progress Notes (Signed)
Pre visit review using our clinic review tool, if applicable. No additional management support is needed unless otherwise documented below in the visit note. 
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
## 2015-03-07 NOTE — Patient Instructions (Signed)
Good to see you.  Ice 20 minutes 2 times daily. Usually after activity and before bed. Exercises 3 times a week and really after a lot of walking.  Take tylenol 500 mg three times a day is the best evidence based medicine we have for arthritis.  Vitamin D 2000 IU daily Fish oil 2 grams daily.  Tumeric 500mg  twice daily.  Capsaicin topically up to four times a day may also help with pain. Good shoes with rigid bottom.  [name], [place] or New balance greater then [employer] and cycling with low resistance are the best two types of exercise for arthritis. Come back and see me in 3-4 weeks or after your trip.

## 2015-03-07 NOTE — Assessment & Plan Note (Signed)
Patient's low back pain is likely secondary to exacerbation of underlying arthritis. We discussed the possibility of x-rays today but we do not think that that would change medical management with her feeling somewhat better at this time. Patient does have anti-inflammatory and did have a refill of muscle relaxer for any type of flares. Patient worked with Product/process development scientist today to find home exercises in greater detail. We discussed over-the-counter natural supplementations to slow down the progression of arthritis as well as decrease inflammation. We discussed an icing protocol. Discussed proper shoewear. Patient will try to make these changes and come back after her trip and discuss further. If continuing have pain after her trip we may need to consider further imaging.

## 2015-04-04 ENCOUNTER — Ambulatory Visit: Admitting: Family Medicine

## 2015-04-30 ENCOUNTER — Ambulatory Visit (INDEPENDENT_AMBULATORY_CARE_PROVIDER_SITE_OTHER): Admitting: Family Medicine

## 2015-04-30 ENCOUNTER — Encounter: Payer: Self-pay | Admitting: Family Medicine

## 2015-04-30 VITALS — BP 122/87 | HR 87 | Temp 97.2°F | Ht 64.0 in | Wt 186.6 lb

## 2015-04-30 DIAGNOSIS — J208 Acute bronchitis due to other specified organisms: Secondary | ICD-10-CM

## 2015-04-30 MED ORDER — BETAMETHASONE SOD PHOS & ACET 6 (3-3) MG/ML IJ SUSP
6.0000 mg | Freq: Once | INTRAMUSCULAR | Status: AC
  Administered 2015-04-30: 6 mg via INTRAMUSCULAR

## 2015-04-30 MED ORDER — AMOXICILLIN-POT CLAVULANATE 875-125 MG PO TABS: 1.0000 | ORAL_TABLET | Freq: Two times a day (BID) | ORAL | Status: DC
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
## 2015-09-20 ENCOUNTER — Telehealth: Payer: Self-pay | Admitting: Family

## 2016-03-06 ENCOUNTER — Ambulatory Visit (INDEPENDENT_AMBULATORY_CARE_PROVIDER_SITE_OTHER): Payer: PPO | Admitting: Family

## 2016-03-06 ENCOUNTER — Encounter: Payer: Self-pay | Admitting: Family

## 2016-03-06 VITALS — BP 133/83 | HR 72 | Temp 97.6°F | Ht 64.0 in | Wt 190.2 lb

## 2016-03-06 DIAGNOSIS — R609 Edema, unspecified: Secondary | ICD-10-CM | POA: Diagnosis not present

## 2016-03-06 DIAGNOSIS — R600 Localized edema: Secondary | ICD-10-CM

## 2016-03-06 NOTE — Patient Instructions (Signed)
Peripheral Edema You have swelling in your legs (peripheral edema). This swelling is due to excess accumulation of salt and water in your body. Edema may be a sign of heart, kidney or liver disease, or a side effect of a medication. It may also be due to problems in the leg veins. Elevating your legs and using special support stockings may be very helpful, if the cause of the swelling is due to poor venous circulation. Avoid long periods of standing, whatever the cause. Treatment of edema depends on identifying the cause. Chips, pretzels, pickles and other salty foods should be avoided. Restricting salt in your diet is almost always needed. Water pills (diuretics) are often used to remove the excess salt and water from your body via urine. These medicines prevent the kidney from reabsorbing sodium. This increases urine flow. Diuretic treatment may also result in lowering of potassium levels in your body. Potassium supplements may be needed if you have to use diuretics daily. Daily weights can help you keep track of your progress in clearing your edema. You should call your caregiver for follow up care as recommended. SEEK IMMEDIATE MEDICAL CARE IF:   You have increased swelling, pain, redness, or heat in your legs.  You develop shortness of breath, especially when lying down.  You develop chest or abdominal pain, weakness, or fainting.  You have a fever.   This information is not intended to replace advice given to you by your health care provider. Make sure you discuss any questions you have with your health care provider.   Document Released: 08/14/2004 Document Revised: 09/29/2011 Document Reviewed: 01/17/2015 Elsevier Interactive Patient Education 2016 [employer] Edema Edema is an abnormal buildup of fluids in your bodytissues. Edema is somewhatdependent on gravity to pull the fluid to the lowest place in your body. That makes the condition more common in the legs and thighs (lower  extremities). Painless swelling of the feet and ankles is common and becomes more likely as you get older. It is also common in looser tissues, like around your eyes.  When the affected area is squeezed, the fluid may move out of that spot and leave a dent for a few moments. This dent is called pitting.  CAUSES  There are many possible causes of edema. Eating too much salt and being on your feet or sitting for a long time can cause edema in your legs and ankles. Hot weather may make edema worse. Common medical causes of edema include:  Heart failure.  Liver disease.  Kidney disease.  Weak blood vessels in your legs.  Cancer.  An injury.  Pregnancy.  Some medications.  Obesity. SYMPTOMS  Edema is usually painless.Your skin may look swollen or shiny.  DIAGNOSIS  Your health care provider may be able to diagnose edema by asking about your medical history and doing a physical exam. You may need to have tests such as X-rays, an electrocardiogram, or blood tests to check for medical conditions that may cause edema.  TREATMENT  Edema treatment depends on the cause. If you have heart, liver, or kidney disease, you need the treatment appropriate for these conditions. General treatment may include:  Elevation of the affected body part above the level of your heart.  Compression of the affected body part. Pressure from elastic bandages or support stockings squeezes the tissues and forces fluid back into the blood vessels. This keeps fluid from entering the tissues.  Restriction of fluid and salt intake.  Use of a water pill (  diuretic). These medications are appropriate only for some types of edema. They pull fluid out of your body and make you urinate more often. This gets rid of fluid and reduces swelling, but diuretics can have side effects. Only use diuretics as directed by your health care provider. HOME CARE INSTRUCTIONS   Keep the affected body part above the level of your heart  when you are lying down.   Do not sit still or stand for prolonged periods.   Do not put anything directly under your knees when lying down.  Do not wear constricting clothing or garters on your upper legs.   Exercise your legs to work the fluid back into your blood vessels. This may help the swelling go down.   Wear elastic bandages or support stockings to reduce ankle swelling as directed by your health care provider.   Eat a low-salt diet to reduce fluid if your health care provider recommends it.   Only take medicines as directed by your health care provider. SEEK MEDICAL CARE IF:   Your edema is not responding to treatment.  You have heart, liver, or kidney disease and notice symptoms of edema.  You have edema in your legs that does not improve after elevating them.   You have sudden and unexplained weight gain. SEEK IMMEDIATE MEDICAL CARE IF:   You develop shortness of breath or chest pain.   You cannot breathe when you lie down.  You develop pain, redness, or warmth in the swollen areas.   You have heart, liver, or kidney disease and suddenly get edema.  You have a fever and your symptoms suddenly get worse. MAKE SURE YOU:   Understand these instructions.  Will watch your condition.  Will get help right away if you are not doing well or get worse.   This information is not intended to replace advice given to you by your health care provider. Make sure you discuss any questions you have with your health care provider.   Document Released: 07/07/2005 Document Revised: 07/28/2014 Document Reviewed: 04/29/2013 Elsevier Interactive Patient Education [employer].

## 2016-03-06 NOTE — Progress Notes (Signed)
   Subjective:    Patient ID: Kimberly [name], female    DOB: Dec 10, 1950, 65 y.o.   MRN: [id number]  HPI Pt presents to the office with bilateral edema in feet. PT states this started 08/01 while she was on vacation. PT states it has since resolved, but was worried about what caused her swelling. PT had driven to [place] and denies any increase salt in diet. Pt denies any headache, palpitations, SOB, or edema at this time.      Review of Systems  Constitutional: Negative.   HENT: Negative.   Eyes: Negative.   Respiratory: Negative.  Negative for shortness of breath.   Cardiovascular: Negative.  Negative for chest pain, palpitations and leg swelling (Resolved at this time).  Gastrointestinal: Negative.  Negative for diarrhea and rectal pain.  Endocrine: Negative.   Genitourinary: Negative.   Musculoskeletal: Negative.   Neurological: Negative.  Negative for headaches.  Hematological: Negative.   Psychiatric/Behavioral: Negative.   All other systems reviewed and are negative.      Objective:   Physical Exam  Constitutional: She is oriented to person, place, and time. She appears well-developed and well-nourished. No distress.  HENT:  Head: Normocephalic and atraumatic.  Eyes: Pupils are equal, round, and reactive to light.  Neck: Normal range of motion. Neck supple. No thyromegaly present.  Cardiovascular: Normal rate, regular rhythm, normal heart sounds and intact distal pulses.   No murmur heard. Pulmonary/Chest: Effort normal and breath sounds normal. No respiratory distress. She has no wheezes.  Abdominal: Soft. Bowel sounds are normal. She exhibits no distension. There is no tenderness.  Musculoskeletal: Normal range of motion. She exhibits no edema or tenderness.  Neurological: She is alert and oriented to person, place, and time.  Skin: Skin is warm and dry.  Psychiatric: She has a normal mood and affect. Her behavior is normal. Judgment and thought content normal.    Vitals reviewed.    BP (!) 136/103   Pulse 72   Temp 97.6 F (36.4 C) (Oral)   Ht '5\' 4"'$  (1.626 m)   Wt 190 lb 3.2 oz (86.3 kg)   BMI 32.65 kg/m       Assessment & Plan:  1. Peripheral edema -Low salt diet -Do not stand or sit for long periods of time -Keep legs elevated when possible -If returns may think about adding diuretic, but since it has resolved we will hold off. RTO prn - CMP14+EGFR - Brain natriuretic peptide  Evelina Dun, FNP

## 2016-03-07 LAB — CMP14+EGFR
ALT: 11 IU/L (ref 0–32)
AST: 14 IU/L (ref 0–40)
Albumin/Globulin Ratio: 1.5 (ref 1.2–2.2)
Albumin: 3.9 g/dL (ref 3.6–4.8)
Alkaline Phosphatase: 109 IU/L (ref 39–117)
BUN/Creatinine Ratio: 18 (ref 12–28)
BUN: 20 mg/dL (ref 8–27)
Bilirubin Total: 0.4 mg/dL (ref 0.0–1.2)
CO2: 23 mmol/L (ref 18–29)
Calcium: 9.3 mg/dL (ref 8.7–10.3)
Chloride: 102 mmol/L (ref 96–106)
Creatinine, Ser: 1.11 mg/dL — ABNORMAL HIGH (ref 0.57–1.00)
GFR calc Af Amer: 60 mL/min/{1.73_m2} (ref >59)
GFR calc non Af Amer: 52 mL/min/{1.73_m2} — ABNORMAL LOW (ref >59)
Globulin, Total: 2.6 g/dL (ref 1.5–4.5)
Glucose: 77 mg/dL (ref 65–99)
Potassium: 4.6 mmol/L (ref 3.5–5.2)
Sodium: 141 mmol/L (ref 134–144)
Total Protein: 6.5 g/dL (ref 6.0–8.5)

## 2016-03-07 LAB — BRAIN NATRIURETIC PEPTIDE: BNP: 79.6 pg/mL (ref 0.0–100.0)

## 2016-07-30 DIAGNOSIS — Z0289 Encounter for other administrative examinations: Secondary | ICD-10-CM

## 2016-08-25 DIAGNOSIS — J102 Influenza due to other identified influenza virus with gastrointestinal manifestations: Secondary | ICD-10-CM | POA: Diagnosis not present

## 2016-08-26 ENCOUNTER — Ambulatory Visit (INDEPENDENT_AMBULATORY_CARE_PROVIDER_SITE_OTHER): Payer: Medicare HMO

## 2016-08-26 ENCOUNTER — Ambulatory Visit (INDEPENDENT_AMBULATORY_CARE_PROVIDER_SITE_OTHER): Payer: Medicare HMO | Admitting: Family Medicine

## 2016-08-26 VITALS — BP 134/92 | HR 97 | Temp 101.3°F | Ht 64.0 in | Wt 183.2 lb

## 2016-08-26 DIAGNOSIS — R509 Fever, unspecified: Secondary | ICD-10-CM | POA: Diagnosis not present

## 2016-08-26 DIAGNOSIS — J101 Influenza due to other identified influenza virus with other respiratory manifestations: Secondary | ICD-10-CM

## 2016-08-26 DIAGNOSIS — R6883 Chills (without fever): Secondary | ICD-10-CM | POA: Diagnosis not present

## 2016-08-26 DIAGNOSIS — R110 Nausea: Secondary | ICD-10-CM | POA: Diagnosis not present

## 2016-08-26 LAB — VERITOR FLU A/B WAIVED
Influenza A: NEGATIVE
Influenza B: POSITIVE — AB

## 2016-08-26 NOTE — Progress Notes (Signed)
   HPI  Patient presents today here with flulike symptoms.  Patient has had 2 day onset of headache, nausea, diarrhea, chills, and sore throat.  She was seen in urgent care 1 day ago and diagnosed with influenza clinically and treated with Tamiflu.  She feels the same. She is concerned because she has cough and chest pain with cough. She states that her chest congestion is very thick and hard to come up.  He requests influenza testing today.  PMH: Smoking status noted ROS: Per HPI  Objective: BP (!) 134/92   Pulse 97   Temp (!) 101.3 F (38.5 C) (Oral)   Ht 5\' 4"  (1.626 m)   Wt 183 lb 3.2 oz (83.1 kg)   BMI 31.45 kg/m  Gen: NAD, alert, cooperative with exam HEENT: NCAT, oropharynx moist and clear CV: RRR, good S1/S2, no murmur Resp: CTABL, no wheezes, non-labored Abd: SNTND, BS present, no guarding or organomegaly Ext: No edema, warm Neuro: Alert and oriented, No gross deficits  Assessment and plan:  # Influenza B, fever Patient with typical symptoms of influenza With complaints of chest pain with cough and severe cough with thick chest congestion I have ordered a chest x-ray to rule out underlying pneumonia as well Continue Tamiflu, discussed supportive care at length Return to clinic with any concerns or worsening symptoms. Patient appears very well hydrated.    Orders Placed This Encounter  Procedures  . Veritor Flu A/B Waived    Order Specific Question:   Source    Answer:   nasal  . DG Chest 2 View    Standing Status:   Future    Standing Expiration Date:   10/24/2017    Order Specific Question:   Reason for Exam (SYMPTOM  OR DIAGNOSIS REQUIRED)    Answer:   Flu, Chest pain and cough, eval for superimposed CAP    Order Specific Question:   Preferred imaging location?    Answer:   Internal    Meds ordered this encounter  Medications  . ondansetron (ZOFRAN) 4 MG tablet    Sig: Take 4 mg by mouth every 8 (eight) hours as needed for nausea or vomiting.  .  benzonatate (TESSALON) 100 MG capsule    Sig: Take by mouth 3 (three) times daily as needed for cough.  Marland Kitchen albuterol (PROVENTIL HFA;VENTOLIN HFA) 108 (90 Base) MCG/ACT inhaler    Sig: Inhale into the lungs every 6 (six) hours as needed for wheezing or shortness of breath.  . oseltamivir (TAMIFLU) 75 MG capsule    Sig: Take 75 mg by mouth daily.    Laroy Apple, MD [facility] 08/26/2016, 8:32 AM

## 2016-08-26 NOTE — Patient Instructions (Signed)
Great to see you!   Influenza, Adult Influenza, more commonly known as "the flu," is a viral infection that primarily affects the respiratory tract. The respiratory tract includes organs that help you breathe, such as the lungs, nose, and throat. The flu causes many common cold symptoms, as well as a high fever and body aches. The flu spreads easily from person to person (is contagious). Getting a flu shot (influenza vaccination) every year is the best way to prevent influenza. What are the causes? Influenza is caused by a virus. You can catch the virus by:  Breathing in droplets from an infected person's cough or sneeze.  Touching something that was recently contaminated with the virus and then touching your mouth, nose, or eyes. What increases the risk? The following factors may make you more likely to get the flu:  Not cleaning your hands frequently with soap and water or alcohol-based hand sanitizer.  Having close contact with many people during cold and flu season.  Touching your mouth, eyes, or nose without washing or sanitizing your hands first.  Not drinking enough fluids or not eating a healthy diet.  Not getting enough sleep or exercise.  Being under a high amount of stress.  Not getting a yearly (annual) flu shot. You may be at a higher risk of complications from the flu, such as a severe lung infection (pneumonia), if you:  Are over the age of 65.  Are pregnant.  Have a weakened disease-fighting system (immune system). You may have a weakened immune system if you:  Have HIV or AIDS.  Are undergoing chemotherapy.  Aretaking medicines that reduce the activity of (suppress) the immune system.  Have a long-term (chronic) illness, such as heart disease, kidney disease, diabetes, or lung disease.  Have a liver disorder.  Are obese.  Have anemia. What are the signs or symptoms? Symptoms of this condition typically last 4-10 days and may  include:  Fever.  Chills.  Headache, body aches, or muscle aches.  Sore throat.  Cough.  Runny or congested nose.  Chest discomfort and cough.  Poor appetite.  Weakness or tiredness (fatigue).  Dizziness.  Nausea or vomiting. How is this diagnosed? This condition may be diagnosed based on your medical history and a physical exam. Your health care provider may do a nose or throat swab test to confirm the diagnosis. How is this treated? If influenza is detected early, you can be treated with antiviral medicine that can reduce the length of your illness and the severity of your symptoms. This medicine may be given by mouth (orally) or through an IV tube that is inserted in one of your veins. The goal of treatment is to relieve symptoms by taking care of yourself at home. This may include taking over-the-counter medicines, drinking plenty of fluids, and adding humidity to the air in your home. In some cases, influenza goes away on its own. Severe influenza or complications from influenza may be treated in a hospital. Follow these instructions at home:  Take over-the-counter and prescription medicines only as told by your health care provider.  Use a cool mist humidifier to add humidity to the air in your home. This can make breathing easier.  Rest as needed.  Drink enough fluid to keep your urine clear or pale yellow.  Cover your mouth and nose when you cough or sneeze.  Wash your hands with soap and water often, especially after you cough or sneeze. If soap and water are not available, use   hand sanitizer.  Stay home from work or school as told by your health care provider. Unless you are visiting your health care provider, try to avoid leaving home until your fever has been gone for 24 hours without the use of medicine.  Keep all follow-up visits as told by your health care provider. This is important. How is this prevented?  Getting an annual flu shot is the best way to  avoid getting the flu. You may get the flu shot in late summer, fall, or winter. Ask your health care provider when you should get your flu shot.  Wash your hands often or use hand sanitizer often.  Avoid contact with people who are sick during cold and flu season.  Eat a healthy diet, drink plenty of fluids, get enough sleep, and exercise regularly. Contact a health care provider if:  You develop new symptoms.  You have:  Chest pain.  Diarrhea.  A fever.  Your cough gets worse.  You produce more mucus.  You feel nauseous or you vomit. Get help right away if:  You develop shortness of breath or difficulty breathing.  Your skin or nails turn a bluish color.  You have severe pain or stiffness in your neck.  You develop a sudden headache or sudden pain in your face or ear.  You cannot stop vomiting. This information is not intended to replace advice given to you by your health care provider. Make sure you discuss any questions you have with your health care provider. Document Released: 07/04/2000 Document Revised: 12/13/2015 Document Reviewed: 05/01/2015 Elsevier Interactive Patient Education  2017 [employer]  

## 2016-08-27 ENCOUNTER — Telehealth: Payer: Self-pay | Admitting: Family

## 2016-08-27 MED ORDER — ALBUTEROL SULFATE HFA 108 (90 BASE) MCG/ACT IN AERS: 2.0000 | INHALATION_SPRAY | Freq: Four times a day (QID) | RESPIRATORY_TRACT | 1 refills | Status: DC | PRN

## 2016-08-27 NOTE — Telephone Encounter (Signed)
Tessalon not helping. Patient would like something else called in. Patient seen [facility] yesterday. Please advise and send back to the pools.

## 2016-08-27 NOTE — Telephone Encounter (Signed)
Patient aware and verbalizes understanding. 

## 2016-08-27 NOTE — Telephone Encounter (Signed)
Instructed patient to continue [employer]. Also use albuterol inhaler 2 puffs every 4-6 hours every day for the next week. This can help open her lungs and reduce the amount of cough. I sent a refill in if she does not have one.

## 2016-09-08 ENCOUNTER — Ambulatory Visit

## 2016-09-09 ENCOUNTER — Ambulatory Visit (INDEPENDENT_AMBULATORY_CARE_PROVIDER_SITE_OTHER): Payer: Medicare HMO | Admitting: Family Medicine

## 2016-09-09 ENCOUNTER — Encounter: Payer: Self-pay | Admitting: Family Medicine

## 2016-09-09 VITALS — BP 124/88 | HR 75 | Temp 97.1°F | Ht 64.0 in | Wt 181.0 lb

## 2016-09-09 DIAGNOSIS — J111 Influenza due to unidentified influenza virus with other respiratory manifestations: Secondary | ICD-10-CM

## 2016-09-09 NOTE — Progress Notes (Signed)
   HPI  Patient presents today for follow-up of influenza.  Patient states that she is feeling better, on a consistent basis she feels like she is getting better every day.  She does continue to have easy fatigability, congestion, and cough. She denies any dyspnea or difficulty tolerating foods or fluids. She states that she still taking naps for several hours a day.  PMH: Smoking status noted ROS: Per HPI  Objective: BP 124/88   Pulse 75   Temp 97.1 F (36.2 C) (Oral)   Ht 5\' 4"  (1.626 m)   Wt 181 lb (82.1 kg)   BMI 31.07 kg/m  Gen: NAD, alert, cooperative with exam HEENT: NCAT, oropharynx moist and clear, TMs normal bilaterally, nares clear, no tenderness to palpation of the sinuses bilaterally CV: RRR, good S1/S2, no murmur Resp: CTABL, no wheezes, non-labored Ext: No edema, warm Neuro: Alert and oriented, No gross deficits  Assessment and plan:  # Influenza Resolving, patient now with residual effects from severe viral infection She appears well hydrated and has clear lungs. I don't have any suspicion for underlying serious bacterial infection. Low threshold for follow-up of symptoms worsen, develops fever again, or shortness of breath. Chest usual course of illness, may have lingering effects for a few weeks.   Laroy Apple, MD [facility] 09/09/2016, 11:10 AM

## 2016-09-17 ENCOUNTER — Encounter: Payer: Self-pay | Admitting: Family Medicine

## 2016-09-17 ENCOUNTER — Ambulatory Visit (INDEPENDENT_AMBULATORY_CARE_PROVIDER_SITE_OTHER): Payer: Medicare HMO | Admitting: Family Medicine

## 2016-09-17 VITALS — BP 134/83 | HR 91 | Temp 97.4°F | Ht 64.0 in | Wt 182.0 lb

## 2016-09-17 DIAGNOSIS — J069 Acute upper respiratory infection, unspecified: Secondary | ICD-10-CM | POA: Diagnosis not present

## 2016-09-17 DIAGNOSIS — R110 Nausea: Secondary | ICD-10-CM | POA: Diagnosis not present

## 2016-09-17 DIAGNOSIS — B9789 Other viral agents as the cause of diseases classified elsewhere: Secondary | ICD-10-CM

## 2016-09-17 LAB — VERITOR FLU A/B WAIVED
Influenza A: NEGATIVE
Influenza B: NEGATIVE

## 2016-09-17 NOTE — Patient Instructions (Addendum)
Great to see you!  Come back if you have any concerns, for now get plenty of rest, fluids, and take tylenol as needed.    Viral Respiratory Infection A respiratory infection is an illness that affects part of the respiratory system, such as the lungs, nose, or throat. Most respiratory infections are caused by either viruses or bacteria. A respiratory infection that is caused by a virus is called a viral respiratory infection. Common types of viral respiratory infections include:  A cold.  The flu (influenza).  A respiratory syncytial virus (RSV) infection. How do I know if I have a viral respiratory infection? Most viral respiratory infections cause:  A stuffy or runny nose.  Yellow or green nasal discharge.  A cough.  Sneezing.  Fatigue.  Achy muscles.  A sore throat.  Sweating or chills.  A fever.  A headache. How are viral respiratory infections treated? If influenza is diagnosed early, it may be treated with an antiviral medicine that shortens the length of time a person has symptoms. Symptoms of viral respiratory infections may be treated with over-the-counter and prescription medicines, such as:  Expectorants. These make it easier to cough up mucus.  Decongestant nasal sprays. Health care providers do not prescribe antibiotic medicines for viral infections. This is because antibiotics are designed to kill bacteria. They have no effect on viruses. How do I know if I should stay home from work or school? To avoid exposing others to your respiratory infection, stay home if you have:  A fever.  A persistent cough.  A sore throat.  A runny nose.  Sneezing.  Muscles aches.  Headaches.  Fatigue.  Weakness.  Chills.  Sweating.  Nausea. Follow these instructions at home:  Rest as much as possible.  Take over-the-counter and prescription medicines only as told by your health care provider.  Drink enough fluid to keep your urine clear or pale  yellow. This helps prevent dehydration and helps loosen up mucus.  Gargle with a salt-water mixture 3-4 times per day or as needed. To make a salt-water mixture, completely dissolve -1 tsp of salt in 1 cup of warm water.  Use nose drops made from salt water to ease congestion and soften raw skin around your nose.  Do not drink alcohol.  Do not use tobacco products, including cigarettes, chewing tobacco, and e-cigarettes. If you need help quitting, ask your health care provider. Contact a health care provider if:  Your symptoms last for 10 days or longer.  Your symptoms get worse over time.  You have a fever.  You have severe sinus pain in your face or forehead.  The glands in your jaw or neck become very swollen. Get help right away if:  You feel pain or pressure in your chest.  You have shortness of breath.  You faint or feel like you will faint.  You have severe and persistent vomiting.  You feel confused or disoriented. This information is not intended to replace advice given to you by your health care provider. Make sure you discuss any questions you have with your health care provider. Document Released: 04/16/2005 Document Revised: 12/13/2015 Document Reviewed: 12/13/2014 Elsevier Interactive Patient Education  2017 [employer].

## 2016-09-17 NOTE — Progress Notes (Signed)
   HPI  Patient presents today here with cough and cold symptoms.  Patient was diagnosed with influenza B3 weeks ago, she just began to feel better about one week ago. Now she has had one day onset of runny nose, cough productive of mucus that has not been seen, nausea, and fatigue.  She has not had any sick exposure, however she has had one trip to [place] in the last week. She's taken Sudafed without improvement, she denies any shortness of breath  PMH: Smoking status noted ROS: Per HPI  Objective: BP 134/83   Pulse 91   Temp 97.4 F (36.3 C) (Oral)   Ht 5\' 4"  (1.626 m)   Wt 182 lb (82.6 kg)   BMI 31.24 kg/m  Gen: NAD, alert, cooperative with exam HEENT: NCAT, oropharynx clear and moist, TMs normal bilaterally, nares clear CV: RRR, good S1/S2, no murmur Resp: CTABL, no wheezes, non-labored Ext: No edema, warm Neuro: Alert and oriented, No gross deficits  Assessment and plan:  #Viral respiratory infection Most likely viral etiology Reassurance provided, also discussed allergic rhinitis and starting daily antihistamine Return to clinic with any concerns.      Orders Placed This Encounter  Procedures  . Veritor Flu A/B Waived    Order Specific Question:   Source    Answer:   nasal    No orders of the defined types were placed in this encounter.   Laroy Apple, MD [facility] 09/17/2016, 4:56 PM

## 2016-11-03 ENCOUNTER — Encounter: Payer: Self-pay | Admitting: Nurse Practitioner

## 2016-11-03 ENCOUNTER — Ambulatory Visit (HOSPITAL_COMMUNITY)
Admission: RE | Admit: 2016-11-03 | Discharge: 2016-11-03 | Disposition: A | Discharge status: Home / Self Care | Payer: Medicare HMO | Source: Ambulatory Visit | Attending: Nurse Practitioner | Admitting: Nurse Practitioner

## 2016-11-03 ENCOUNTER — Ambulatory Visit (INDEPENDENT_AMBULATORY_CARE_PROVIDER_SITE_OTHER): Payer: Medicare HMO | Admitting: Nurse Practitioner

## 2016-11-03 ENCOUNTER — Ambulatory Visit (INDEPENDENT_AMBULATORY_CARE_PROVIDER_SITE_OTHER): Payer: Medicare HMO

## 2016-11-03 ENCOUNTER — Other Ambulatory Visit: Payer: Self-pay | Admitting: Nurse Practitioner

## 2016-11-03 VITALS — BP 116/78 | HR 86 | Temp 97.1°F | Ht 64.0 in | Wt 181.0 lb

## 2016-11-03 DIAGNOSIS — R1030 Lower abdominal pain, unspecified: Secondary | ICD-10-CM | POA: Insufficient documentation

## 2016-11-03 DIAGNOSIS — R509 Fever, unspecified: Secondary | ICD-10-CM | POA: Diagnosis present

## 2016-11-03 DIAGNOSIS — I700 Atherosclerosis of aorta: Secondary | ICD-10-CM | POA: Diagnosis not present

## 2016-11-03 DIAGNOSIS — K5732 Diverticulitis of large intestine without perforation or abscess without bleeding: Secondary | ICD-10-CM | POA: Diagnosis not present

## 2016-11-03 DIAGNOSIS — R109 Unspecified abdominal pain: Secondary | ICD-10-CM | POA: Diagnosis not present

## 2016-11-03 LAB — URINALYSIS, COMPLETE
Bilirubin, UA: NEGATIVE
Glucose, UA: NEGATIVE
Nitrite, UA: NEGATIVE
Specific Gravity, UA: 1.015 (ref 1.005–1.030)
Urobilinogen, Ur: 0.2 mg/dL (ref 0.2–1.0)
pH, UA: 5.5 (ref 5.0–7.5)

## 2016-11-03 LAB — MICROSCOPIC EXAMINATION: Renal Epithel, UA: NONE SEEN /hpf

## 2016-11-03 LAB — POCT I-STAT CREATININE: Creatinine, Ser: 0.8 mg/dL (ref 0.44–1.00)

## 2016-11-03 MED ORDER — IOPAMIDOL (ISOVUE-300) INJECTION 61%
100.0000 mL | Freq: Once | INTRAVENOUS | Status: AC | PRN
  Administered 2016-11-03: 100 mL via INTRAVENOUS

## 2016-11-03 MED ORDER — CIPROFLOXACIN HCL 500 MG PO TABS: 500.0000 mg | ORAL_TABLET | Freq: Two times a day (BID) | ORAL | 0 refills | Status: DC

## 2016-11-03 MED ORDER — METRONIDAZOLE 500 MG PO TABS: 500.0000 mg | ORAL_TABLET | Freq: Two times a day (BID) | ORAL | 0 refills | Status: DC

## 2016-11-03 NOTE — Progress Notes (Signed)
   Subjective:    Patient ID: Kimberly [name], female    DOB: Aug 01, 1950, 66 y.o.   MRN: [id number]  HPI Patient comes in alone today c/o abdominal pain- started Friday night and she thought maybe it was constipation- took Oakley nad had bowel movement- pain still occurred intermittently after that rated 6/10. SHe has had slight nausea an dlow grade fever of 99.8.     Review of Systems  Constitutional: Positive for appetite change (decreased) and fever.  HENT: Negative.   Respiratory: Negative.   Cardiovascular: Negative.   Gastrointestinal: Positive for abdominal pain and nausea. Negative for constipation, diarrhea and vomiting.  Genitourinary: Positive for dysuria, frequency and urgency.  Neurological: Negative.   Psychiatric/Behavioral: Negative.        Objective:   Physical Exam  Constitutional: She is oriented to person, place, and time. She appears well-developed and well-nourished. No distress.  Cardiovascular: Normal rate and regular rhythm.   Pulmonary/Chest: Effort normal and breath sounds normal.  Abdominal: Soft. She exhibits no mass. There is tenderness (across lower abdomen on light palpation). There is no rebound and no guarding.  Hyperactive bowel sounds  Neurological: She is alert and oriented to person, place, and time.  Skin: Skin is warm.  Psychiatric: She has a normal mood and affect. Her behavior is normal. Judgment and thought content normal.   BP 116/78   Pulse 86   Temp 97.1 F (36.2 C) (Oral)   Ht 5\' 4"  (1.626 m)   Wt 181 lb (82.1 kg)   BMI 31.07 kg/m   KUB- early ileus-Preliminary reading by Ronnald Collum, FNP  [facility]      Assessment & Plan:  1. Lower abdominal pain NPO until test is complete - CBC with Differential/Platelet - Urinalysis, Complete - DG Abd 1 View; Future - CT Abdomen Pelvis W Contrast; Future  Mary-Margaret Hassell Done, FNP

## 2016-11-04 LAB — CBC WITH DIFFERENTIAL/PLATELET
Basophils Absolute: 0 10*3/uL (ref 0.0–0.2)
Basos: 0 %
EOS (ABSOLUTE): 0 10*3/uL (ref 0.0–0.4)
Eos: 0 %
Hematocrit: 38 % (ref 34.0–46.6)
Hemoglobin: 12.4 g/dL (ref 11.1–15.9)
Immature Grans (Abs): 0 10*3/uL (ref 0.0–0.1)
Immature Granulocytes: 0 %
Lymphocytes Absolute: 1.7 10*3/uL (ref 0.7–3.1)
Lymphs: 18 %
MCH: 28.8 pg (ref 26.6–33.0)
MCHC: 32.6 g/dL (ref 31.5–35.7)
MCV: 88 fL (ref 79–97)
Monocytes Absolute: 0.8 10*3/uL (ref 0.1–0.9)
Monocytes: 8 %
Neutrophils Absolute: 6.9 10*3/uL (ref 1.4–7.0)
Neutrophils: 74 %
Platelets: 276 10*3/uL (ref 150–379)
RBC: 4.3 x10E6/uL (ref 3.77–5.28)
RDW: 14.4 % (ref 12.3–15.4)
WBC: 9.5 10*3/uL (ref 3.4–10.8)

## 2016-11-05 ENCOUNTER — Telehealth: Payer: Self-pay | Admitting: Family

## 2016-11-05 NOTE — Telephone Encounter (Signed)
Made an appt to discuss Diverticulitis with Dr. Wendi Snipes on 11/13/16 at 12:55.  Patient aware

## 2016-11-11 ENCOUNTER — Telehealth: Payer: Self-pay | Admitting: Family Medicine

## 2016-11-11 MED ORDER — ONDANSETRON 4 MG PO TBDP: 4.0000 mg | ORAL_TABLET | Freq: Three times a day (TID) | ORAL | 1 refills | Status: DC | PRN

## 2016-11-11 NOTE — Telephone Encounter (Signed)
Dxd with diverticulitis nauseated a lot she is going to [place] this afternoon and needs medication for when she has these episodes while she is out of town. Verbal Order per Evelina Dun, Zofran sent to pharmacy

## 2016-11-13 ENCOUNTER — Ambulatory Visit: Payer: Medicare HMO | Admitting: Family Medicine

## 2016-11-14 ENCOUNTER — Telehealth: Payer: Self-pay | Admitting: Family Medicine

## 2016-11-14 MED ORDER — FLUCONAZOLE 150 MG PO TABS: 150.0000 mg | ORAL_TABLET | Freq: Every day | ORAL | 0 refills | Status: DC

## 2016-11-14 NOTE — Telephone Encounter (Signed)
Sorry but can not send rx to [place]

## 2016-11-14 NOTE — Telephone Encounter (Signed)
She is out of town in [place] helping care for her sister - can we call in something there   [facility] - fixed in epic

## 2016-11-14 NOTE — Telephone Encounter (Signed)
pt aware 

## 2017-03-20 ENCOUNTER — Ambulatory Visit (INDEPENDENT_AMBULATORY_CARE_PROVIDER_SITE_OTHER): Payer: Medicare HMO | Admitting: Family Medicine

## 2017-03-20 ENCOUNTER — Encounter: Payer: Self-pay | Admitting: Family Medicine

## 2017-03-20 VITALS — BP 99/70 | HR 66 | Temp 97.6°F | Ht 64.0 in | Wt 181.0 lb

## 2017-03-20 DIAGNOSIS — D485 Neoplasm of uncertain behavior of skin: Secondary | ICD-10-CM | POA: Diagnosis not present

## 2017-03-20 DIAGNOSIS — L821 Other seborrheic keratosis: Secondary | ICD-10-CM | POA: Diagnosis not present

## 2017-03-20 NOTE — Patient Instructions (Signed)
Great to see you!   We will work on a referral for you to see a dermatologist

## 2017-03-20 NOTE — Progress Notes (Signed)
   HPI  Patient presents today here with skin lesion that she's concerned about.  Patient explains that she's had skin lesion for over 5 years, for the last 6-8 months it's been changing, becoming more red, larger, and scaly.  Patient also has 2 spots on her left forehead and on her nose that she's concerned about. They have not changed. She has history of basal cell carcinoma of the skin 2.   PMH: Smoking status noted ROS: Per HPI  Objective: BP 99/70 (BP Location: Right Arm)   Pulse 66   Temp 97.6 F (36.4 C) (Oral)   Ht 5\' 4"  (1.626 m)   Wt 181 lb (82.1 kg)   BMI 31.07 kg/m  Gen: NAD, alert, cooperative with exam HEENT: NCAT CV: RRR, good S1/S2, no murmur Resp: CTABL, no wheezes, non-labored Ext: No edema, warm Neuro: Alert and oriented, No gross deficits Skin:  Left calf with erythematous blanching, scaly lesion measuring 1.2 x 1.2 cm. 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 Ambulatory referral to Dermatology    Referral Priority:   Routine    Referral Type:   Consultation    Referral Reason:   Specialty Services Required    Requested Specialty:   Dermatology    Number of Visits Requested:   1    No orders of the defined types were placed in this encounter.   Laroy Apple, MD [facility] 03/20/2017, 9:36 AM

## 2017-04-16 ENCOUNTER — Other Ambulatory Visit: Payer: Self-pay

## 2017-04-16 DIAGNOSIS — L570 Actinic keratosis: Secondary | ICD-10-CM | POA: Diagnosis not present

## 2017-04-16 DIAGNOSIS — C44712 Basal cell carcinoma of skin of right lower limb, including hip: Secondary | ICD-10-CM | POA: Diagnosis not present

## 2017-04-16 DIAGNOSIS — D229 Melanocytic nevi, unspecified: Secondary | ICD-10-CM | POA: Diagnosis not present

## 2017-05-28 DIAGNOSIS — C44712 Basal cell carcinoma of skin of right lower limb, including hip: Secondary | ICD-10-CM | POA: Diagnosis not present

## 2017-06-10 ENCOUNTER — Ambulatory Visit (INDEPENDENT_AMBULATORY_CARE_PROVIDER_SITE_OTHER): Payer: Medicare HMO | Admitting: *Deleted

## 2017-06-10 DIAGNOSIS — Z23 Encounter for immunization: Secondary | ICD-10-CM | POA: Diagnosis not present

## 2017-08-28 DIAGNOSIS — L570 Actinic keratosis: Secondary | ICD-10-CM | POA: Diagnosis not present

## 2017-08-28 DIAGNOSIS — C44712 Basal cell carcinoma of skin of right lower limb, including hip: Secondary | ICD-10-CM | POA: Diagnosis not present

## 2017-09-29 ENCOUNTER — Encounter: Payer: Self-pay | Admitting: Family Medicine

## 2017-09-29 ENCOUNTER — Ambulatory Visit (INDEPENDENT_AMBULATORY_CARE_PROVIDER_SITE_OTHER): Payer: Medicare HMO | Admitting: Family Medicine

## 2017-09-29 VITALS — BP 136/95 | HR 65 | Temp 96.9°F | Ht 64.0 in | Wt 182.0 lb

## 2017-09-29 DIAGNOSIS — Z1211 Encounter for screening for malignant neoplasm of colon: Secondary | ICD-10-CM | POA: Diagnosis not present

## 2017-09-29 DIAGNOSIS — F32A Depression, unspecified: Secondary | ICD-10-CM

## 2017-09-29 DIAGNOSIS — F329 Major depressive disorder, single episode, unspecified: Secondary | ICD-10-CM

## 2017-09-29 DIAGNOSIS — R69 Illness, unspecified: Secondary | ICD-10-CM | POA: Diagnosis not present

## 2017-09-29 MED ORDER — CITALOPRAM HYDROBROMIDE 10 MG PO TABS: 10.0000 mg | ORAL_TABLET | Freq: Every day | ORAL | 3 refills | Status: DC

## 2017-09-29 NOTE — Patient Instructions (Signed)
Great to see you!   

## 2017-09-29 NOTE — Progress Notes (Signed)
   HPI  Patient presents today here for follow up depression  Pt is willing to have cologuard, doesn't want c scope  Celexa works well, no SI, stable for years on the med.   PMH: Smoking status noted ROS: Per HPI  Objective: BP (!) 136/95   Pulse 65   Temp (!) 96.9 F (36.1 C) (Oral)   Ht 5\' 4"  (1.626 m)   Wt 182 lb (82.6 kg)   BMI 31.24 kg/m  Gen: NAD, alert, cooperative with exam HEENT: NCAT CV: RRR, good S1/S2, no murmur Resp: CTABL, no wheezes, non-labored Ext: No edema, warm Neuro: Alert and oriented, No gross deficits  Depression screen [facility] 2/9 09/29/2017 03/20/2017 11/03/2016 09/17/2016 09/09/2016  Decreased Interest 0 0 0 0 0  Down, Depressed, Hopeless 0 0 0 0 0  PHQ - 2 Score 0 0 0 0 0     Assessment and plan:  # Depression Well controlled Refill celexa  # Screening for colon cancer Cologuard discussed, order placed.    Meds ordered this encounter  Medications  . citalopram (CELEXA) 10 MG tablet    Sig: Take 1 tablet (10 mg total) by mouth daily.    Dispense:  90 tablet    Refill:  [place], MD [facility] 09/29/2017, 1:13 PM

## 2017-10-19 DIAGNOSIS — Z1211 Encounter for screening for malignant neoplasm of colon: Secondary | ICD-10-CM | POA: Diagnosis not present

## 2017-10-19 DIAGNOSIS — Z1212 Encounter for screening for malignant neoplasm of rectum: Secondary | ICD-10-CM | POA: Diagnosis not present

## 2017-10-29 LAB — COLOGUARD: Cologuard: NEGATIVE

## 2018-02-15 ENCOUNTER — Ambulatory Visit (INDEPENDENT_AMBULATORY_CARE_PROVIDER_SITE_OTHER): Payer: Medicare HMO | Admitting: Nurse Practitioner

## 2018-02-15 ENCOUNTER — Encounter: Payer: Self-pay | Admitting: Nurse Practitioner

## 2018-02-15 VITALS — BP 119/73 | HR 66 | Temp 97.0°F | Ht 64.0 in | Wt 177.0 lb

## 2018-02-15 DIAGNOSIS — J069 Acute upper respiratory infection, unspecified: Secondary | ICD-10-CM | POA: Diagnosis not present

## 2018-02-15 MED ORDER — AMOXICILLIN-POT CLAVULANATE 875-125 MG PO TABS: 1.0000 | ORAL_TABLET | Freq: Two times a day (BID) | ORAL | 0 refills | Status: DC

## 2018-02-15 NOTE — Progress Notes (Signed)
   Subjective:    Patient ID: Kimberly [name], female    DOB: 1951/06/06, 67 y.o.   MRN: [id number]   Chief Complaint: cough and congestion  HPI Patient come sin today c/o congestion, runny nose and post nasal drip. Headache and fatigue. Started last week and is no better today. Has tried sudafed and tylenol cold and sinus OTC with no relief. Feels worse today.    Review of Systems  Constitutional: Positive for appetite change (decreased). Negative for chills and fever.  HENT: Positive for congestion, postnasal drip, rhinorrhea and sinus pressure. Negative for ear pain, sore throat and voice change.   Respiratory: Negative for cough, chest tightness and shortness of breath.   Cardiovascular: Negative.   Gastrointestinal: Negative.   Neurological: Positive for headaches.  All other systems reviewed and are negative.      Objective:   Physical Exam  Constitutional: She appears well-developed and well-nourished. She appears distressed (mild).  HENT:  Right Ear: Hearing, tympanic membrane, external ear and ear canal normal.  Left Ear: Hearing, tympanic membrane, external ear and ear canal normal.  Nose: Mucosal edema and rhinorrhea present. Right sinus exhibits no maxillary sinus tenderness and no frontal sinus tenderness. Left sinus exhibits no maxillary sinus tenderness and no frontal sinus tenderness.  Mouth/Throat: Uvula is midline, oropharynx is clear and moist and mucous membranes are normal.   BP 119/73   Pulse 66   Temp (!) 97 F (36.1 C) (Oral)   Ht 5\' 4"  (1.626 m)   Wt 177 lb (80.3 kg)   BMI 30.38 kg/m       Assessment & Plan:  JOHANN GASCOIGNE in today with chief complaint of No chief complaint on file.   1. Upper respiratory infection, acute 1. Take meds as prescribed 2. Use a cool mist humidifier especially during the winter months and when heat has been humid. 3. Use saline nose sprays frequently 4. Saline irrigations of the nose can be very helpful if done  frequently.  * 4X daily for 1 week*  * Use of a nettie pot can be helpful with this. Follow directions with this* 5. Drink plenty of fluids 6. Keep thermostat turn down low 7.For any cough or congestion  Use plain Mucinex- regular strength or max strength is fine   * Children- consult with Pharmacist for dosing 8. For fever or aces or pains- take tylenol or ibuprofen appropriate for age and weight.  * for fevers greater than 101 orally you may alternate ibuprofen and tylenol every  3 hours.   Meds ordered this encounter  Medications  . amoxicillin-clavulanate (AUGMENTIN) 875-125 MG tablet    Sig: Take 1 tablet by mouth 2 (two) times daily.    Dispense:  14 tablet    Refill:  0    Order Specific Question:   Supervising Provider    Answer:   Eustaquio Maize [[id number]]    Mary-Margaret Hassell Done, FNP

## 2018-02-15 NOTE — Patient Instructions (Signed)

## 2018-05-14 ENCOUNTER — Other Ambulatory Visit: Payer: Self-pay

## 2018-05-14 DIAGNOSIS — L570 Actinic keratosis: Secondary | ICD-10-CM | POA: Diagnosis not present

## 2018-05-14 DIAGNOSIS — D485 Neoplasm of uncertain behavior of skin: Secondary | ICD-10-CM | POA: Diagnosis not present

## 2018-05-14 DIAGNOSIS — D225 Melanocytic nevi of trunk: Secondary | ICD-10-CM | POA: Diagnosis not present

## 2018-05-14 DIAGNOSIS — Z85828 Personal history of other malignant neoplasm of skin: Secondary | ICD-10-CM | POA: Diagnosis not present

## 2018-07-20 DIAGNOSIS — S6992XA Unspecified injury of left wrist, hand and finger(s), initial encounter: Secondary | ICD-10-CM | POA: Diagnosis not present

## 2018-07-20 DIAGNOSIS — S6982XA Other specified injuries of left wrist, hand and finger(s), initial encounter: Secondary | ICD-10-CM | POA: Diagnosis not present

## 2018-07-20 DIAGNOSIS — W01198A Fall on same level from slipping, tripping and stumbling with subsequent striking against other object, initial encounter: Secondary | ICD-10-CM | POA: Diagnosis not present

## 2018-07-20 DIAGNOSIS — S60212A Contusion of left wrist, initial encounter: Secondary | ICD-10-CM | POA: Diagnosis not present

## 2018-07-20 DIAGNOSIS — S59912A Unspecified injury of left forearm, initial encounter: Secondary | ICD-10-CM | POA: Diagnosis not present

## 2018-07-20 DIAGNOSIS — G8911 Acute pain due to trauma: Secondary | ICD-10-CM | POA: Diagnosis not present

## 2018-07-22 DIAGNOSIS — M25532 Pain in left wrist: Secondary | ICD-10-CM | POA: Diagnosis not present

## 2018-08-05 DIAGNOSIS — M25532 Pain in left wrist: Secondary | ICD-10-CM | POA: Diagnosis not present

## 2018-08-23 DIAGNOSIS — M25532 Pain in left wrist: Secondary | ICD-10-CM | POA: Diagnosis not present

## 2018-08-30 DIAGNOSIS — S66292A Other specified injury of extensor muscle, fascia and tendon of left thumb at wrist and hand level, initial encounter: Secondary | ICD-10-CM | POA: Diagnosis not present

## 2018-08-30 DIAGNOSIS — Y999 Unspecified external cause status: Secondary | ICD-10-CM | POA: Diagnosis not present

## 2018-09-02 DIAGNOSIS — M25532 Pain in left wrist: Secondary | ICD-10-CM | POA: Diagnosis not present

## 2018-10-05 DIAGNOSIS — M25532 Pain in left wrist: Secondary | ICD-10-CM | POA: Insufficient documentation

## 2018-10-14 DIAGNOSIS — M25642 Stiffness of left hand, not elsewhere classified: Secondary | ICD-10-CM | POA: Diagnosis not present

## 2018-10-14 DIAGNOSIS — M79642 Pain in left hand: Secondary | ICD-10-CM | POA: Diagnosis not present

## 2018-10-19 DIAGNOSIS — M25642 Stiffness of left hand, not elsewhere classified: Secondary | ICD-10-CM | POA: Diagnosis not present

## 2018-10-19 DIAGNOSIS — M79645 Pain in left finger(s): Secondary | ICD-10-CM | POA: Diagnosis not present

## 2018-10-21 DIAGNOSIS — M25642 Stiffness of left hand, not elsewhere classified: Secondary | ICD-10-CM | POA: Diagnosis not present

## 2018-10-21 DIAGNOSIS — M25649 Stiffness of unspecified hand, not elsewhere classified: Secondary | ICD-10-CM | POA: Diagnosis not present

## 2018-10-21 DIAGNOSIS — M79645 Pain in left finger(s): Secondary | ICD-10-CM | POA: Diagnosis not present

## 2018-10-25 DIAGNOSIS — M6281 Muscle weakness (generalized): Secondary | ICD-10-CM | POA: Diagnosis not present

## 2018-10-25 DIAGNOSIS — M25642 Stiffness of left hand, not elsewhere classified: Secondary | ICD-10-CM | POA: Diagnosis not present

## 2018-10-28 DIAGNOSIS — M25642 Stiffness of left hand, not elsewhere classified: Secondary | ICD-10-CM | POA: Diagnosis not present

## 2018-10-28 DIAGNOSIS — M6281 Muscle weakness (generalized): Secondary | ICD-10-CM | POA: Diagnosis not present

## 2018-11-01 ENCOUNTER — Other Ambulatory Visit: Payer: Self-pay

## 2018-11-01 DIAGNOSIS — M25532 Pain in left wrist: Secondary | ICD-10-CM | POA: Diagnosis not present

## 2018-11-01 DIAGNOSIS — R29898 Other symptoms and signs involving the musculoskeletal system: Secondary | ICD-10-CM | POA: Diagnosis not present

## 2018-11-01 DIAGNOSIS — M6281 Muscle weakness (generalized): Secondary | ICD-10-CM | POA: Diagnosis not present

## 2018-11-01 DIAGNOSIS — M79645 Pain in left finger(s): Secondary | ICD-10-CM | POA: Diagnosis not present

## 2018-11-01 DIAGNOSIS — M25642 Stiffness of left hand, not elsewhere classified: Secondary | ICD-10-CM | POA: Diagnosis not present

## 2018-12-27 ENCOUNTER — Other Ambulatory Visit: Payer: Self-pay

## 2018-12-28 ENCOUNTER — Ambulatory Visit (INDEPENDENT_AMBULATORY_CARE_PROVIDER_SITE_OTHER): Payer: Medicare HMO | Admitting: Family Medicine

## 2018-12-28 ENCOUNTER — Encounter: Payer: Self-pay | Admitting: Family Medicine

## 2018-12-28 VITALS — BP 122/70 | HR 70 | Temp 97.8°F | Ht 64.0 in | Wt 186.0 lb

## 2018-12-28 DIAGNOSIS — R69 Illness, unspecified: Secondary | ICD-10-CM | POA: Diagnosis not present

## 2018-12-28 DIAGNOSIS — F339 Major depressive disorder, recurrent, unspecified: Secondary | ICD-10-CM | POA: Diagnosis not present

## 2018-12-28 MED ORDER — CITALOPRAM HYDROBROMIDE 10 MG PO TABS: 10.0000 mg | ORAL_TABLET | Freq: Every day | ORAL | 3 refills | Status: DC

## 2018-12-28 NOTE — Progress Notes (Signed)
    Subjective:   Kimberly [name] is an 68 y.o. female who presents for evaluation and treatment of depressive symptoms.  Onset approximately several years ago, stable since that time.  Current symptoms include insomnia,.  Current treatment for depression:Medication Sleep problems: Mild   Early awakening:Absent   Energy: Good Motivation: Good Concentration: Good Rumination/worrying: Absent Memory: Good Tearfulness: Absent  Anxiety: Absent  Panic: Absent  Overall Mood: Returned to normal  Hopelessness: Absent Suicidal ideation: Absent  Other/Psychosocial Stressors: none Previous treatment modalities employed include Medication.    Review of Systems Constitutional: negative Eyes: negative Ears, nose, mouth, throat, and face: negative Respiratory: negative Cardiovascular: negative Gastrointestinal: negative Neurological: negative Behavioral/Psych: positive for sleep disturbance   Objective:   Mental Status Examination: Posture and motor behavior: Appropriate Dress, grooming, personal hygiene: Appropriate Facial expression: Appropriate Speech: Appropriate Mood: Appropriate Coherency and relevance of thought: Appropriate Thought content: Appropriate Perceptions: Appropriate Orientation:Appropriate Attention and concentration: Appropriate Memory: : Appropriate Vocabulary: Appropriate Abstract reasoning: Appropriate Judgment: Appropriate    Assessment:    Kimberly [name] was seen today for depression.  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  -     citalopram (CELEXA) 10 MG tablet; Take 1 tablet (10 mg total) by mouth daily.   Plan:   Recurrent depression ([place]) - Plan: citalopram (CELEXA) 10 MG tablet  Reviewed concept of depression as biochemical imbalance of neurotransmitters and rationale for treatment. Instructed patient to contact office or on-call physician promptly should condition worsen or any new symptoms  appear and provided on-call telephone numbers.    Pt aware she needs to make an appointment for a CPE.   Return in about 4 weeks (around 01/25/2019), or if symptoms worsen or fail to improve, for CPE.  The above assessment and management plan was discussed with the patient. The patient verbalized understanding of and has agreed to the management plan. Patient is aware to call the clinic if symptoms fail to improve or worsen. Patient is aware when to return to the clinic for a follow-up visit. Patient educated on when it is appropriate to go to the emergency department.   Monia Pouch, FNP-C [facility] [address] [place], [place] [address] [phone]

## 2018-12-28 NOTE — Patient Instructions (Signed)

## 2019-01-13 ENCOUNTER — Encounter: Payer: Self-pay | Admitting: Family Medicine

## 2019-01-13 DIAGNOSIS — S8992XA Unspecified injury of left lower leg, initial encounter: Secondary | ICD-10-CM | POA: Diagnosis not present

## 2019-01-17 ENCOUNTER — Other Ambulatory Visit: Payer: Self-pay | Admitting: Orthopedic Surgery

## 2019-01-17 DIAGNOSIS — M25561 Pain in right knee: Secondary | ICD-10-CM | POA: Diagnosis not present

## 2019-01-17 DIAGNOSIS — M25562 Pain in left knee: Secondary | ICD-10-CM

## 2019-01-19 DIAGNOSIS — M25562 Pain in left knee: Secondary | ICD-10-CM | POA: Diagnosis not present

## 2019-01-24 ENCOUNTER — Other Ambulatory Visit: Payer: Self-pay

## 2019-01-24 DIAGNOSIS — M25562 Pain in left knee: Secondary | ICD-10-CM | POA: Diagnosis not present

## 2019-01-25 ENCOUNTER — Ambulatory Visit: Payer: Self-pay

## 2019-01-25 ENCOUNTER — Encounter: Payer: Self-pay | Admitting: Family Medicine

## 2019-01-25 ENCOUNTER — Encounter: Payer: Medicare HMO | Admitting: Family Medicine

## 2019-01-25 ENCOUNTER — Ambulatory Visit: Payer: Medicare HMO | Admitting: Family Medicine

## 2019-01-25 ENCOUNTER — Ambulatory Visit (INDEPENDENT_AMBULATORY_CARE_PROVIDER_SITE_OTHER): Payer: Medicare HMO | Admitting: Family Medicine

## 2019-01-25 VITALS — BP 120/94 | HR 83 | Ht 64.0 in

## 2019-01-25 VITALS — BP 107/68 | HR 84 | Temp 98.6°F | Ht 64.0 in

## 2019-01-25 DIAGNOSIS — S83512D Sprain of anterior cruciate ligament of left knee, subsequent encounter: Secondary | ICD-10-CM | POA: Diagnosis not present

## 2019-01-25 DIAGNOSIS — T148XXA Other injury of unspecified body region, initial encounter: Secondary | ICD-10-CM

## 2019-01-25 DIAGNOSIS — S86912A Strain of unspecified muscle(s) and tendon(s) at lower leg level, left leg, initial encounter: Secondary | ICD-10-CM | POA: Diagnosis not present

## 2019-01-25 DIAGNOSIS — M25562 Pain in left knee: Secondary | ICD-10-CM | POA: Diagnosis not present

## 2019-01-25 DIAGNOSIS — S83512A Sprain of anterior cruciate ligament of left knee, initial encounter: Secondary | ICD-10-CM | POA: Diagnosis not present

## 2019-01-25 DIAGNOSIS — W19XXXA Unspecified fall, initial encounter: Secondary | ICD-10-CM | POA: Diagnosis not present

## 2019-01-25 DIAGNOSIS — M1712 Unilateral primary osteoarthritis, left knee: Secondary | ICD-10-CM | POA: Diagnosis not present

## 2019-01-25 DIAGNOSIS — W19XXXD Unspecified fall, subsequent encounter: Secondary | ICD-10-CM

## 2019-01-25 NOTE — Patient Instructions (Signed)
Muscle Strain  A muscle strain is an injury that occurs when a muscle is stretched beyond its normal length. Usually, a small number of muscle fibers are torn when this happens. There are three types of muscle strains. First-degree strains have the least amount of muscle fiber tearing and the least amount of pain. Second-degree and third-degree strains have more tearing and pain.  Usually, recovery from muscle strain takes 1-2 weeks. Complete healing normally takes 5-6 weeks.  What are the causes?  This condition is caused when a sudden, violent force is placed on a muscle and stretches it too far. This may occur with a fall, lifting, or sports.  What increases the risk?  This condition is more likely to develop in athletes and people who are physically active.  What are the signs or symptoms?  Symptoms of this condition include:  · Pain.  · Bruising.  · Swelling.  · Trouble using the muscle.  How is this diagnosed?  This condition is diagnosed based on a physical exam and your medical history. Tests may also be done, including an X-ray, ultrasound, or MRI.  How is this treated?  This condition is initially treated with PRICE therapy. This therapy involves:  · Protecting the muscle from being injured again.  · Resting the injured muscle.  · Icing the injured muscle.  · Applying pressure (compression) to the injured muscle. This may be done with a splint or elastic bandage.  · Raising (elevating) the injured muscle.  Your health care provider may also recommend medicine for pain.  Follow these instructions at home:  If you have a splint:  · Wear the splint as told by your health care provider. Remove it only as told by your health care provider.  · Loosen the splint if your fingers or toes tingle, become numb, or turn cold and blue.  · Keep the splint clean.  · If the splint is not waterproof:  ? Do not let it get wet.  ? Cover it with a watertight covering when you take a bath or a shower.  Managing pain, stiffness,  and swelling    · If directed, put ice on the injured area.  ? If you have a removable splint, remove it as told by your health care provider.  ? Put ice in a plastic bag.  ? Place a towel between your skin and the bag.  ? Leave the ice on for 20 minutes, 2-3 times a day.  · Move your fingers or toes often to avoid stiffness and to lessen swelling.  · Raise (elevate) the injured area above the level of your heart while you are sitting or lying down.  · Wear an elastic bandage as told by your health care provider. Make sure that it is not too tight.  General instructions  · Take over-the-counter and prescription medicines only as told by your health care provider.  · Restrict your activity and rest the injured muscle as told by your health care provider. Gentle movements may be allowed.  · If physical therapy was prescribed, do exercises as told by your health care provider.  · Do not put pressure on any part of the splint until it is fully hardened. This may take several hours.  · Do not use any products that contain nicotine or tobacco, such as cigarettes and e-cigarettes. These can delay bone healing. If you need help quitting, ask your health care provider.  · Ask your health care provider when it   You have more pain or swelling in the injured area. Get help right away if:  You have numbness or tingling or lose a lot of strength in the injured area. Summary  A muscle strain is an injury that occurs when a muscle is stretched beyond its normal length.  This condition is caused when a sudden, violent force is placed on a muscle and stretches it too far.  This condition is initially treated with PRICE therapy, which involves protecting, resting,  icing, compressing, and elevating.  Gentle movements may be allowed. If physical therapy was prescribed, do exercises as told by your health care provider. This information is not intended to replace advice given to you by your health care provider. Make sure you discuss any questions you have with your health care provider. Document Released: 07/07/2005 Document Revised: 06/19/2017 Document Reviewed: 08/13/2016 Elsevier Patient Education  2020 [employer].

## 2019-01-25 NOTE — Assessment & Plan Note (Signed)
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  Moderate to severe in the medial compartment on ultrasound.  Did not have any x-rays or MRI but patient has had these done.  My feeling is this would be very similar.  Patient does have significant instability secondary to the ACL tear.  I do believe the patient also likely has a partial tear of the LCL.  Patient been wearing a brace but does not think of doing a significant improvement.  Patient is concerned with the other surgeon to discuss.  Patient was told that he did not want to do any surgical intervention and want her to do physical therapy.  Patient is unable to do there is any more of a long-term situation.  Patient will be referred for a second opinion by another surgeon to see if replacement would be a better opportunity for.  Patient would like something longer term than the ACL repair which I think would only be short-lived.
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
## 2019-01-25 NOTE — Patient Instructions (Signed)
Refer to Dr. Mayer Camel Please drop off MRI See [place] when you need [place]
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
## 2019-01-27 ENCOUNTER — Other Ambulatory Visit: Payer: Self-pay

## 2019-01-27 DIAGNOSIS — M25562 Pain in left knee: Secondary | ICD-10-CM

## 2019-01-27 DIAGNOSIS — G8929 Other chronic pain: Secondary | ICD-10-CM

## 2019-02-03 ENCOUNTER — Encounter: Payer: Self-pay | Admitting: *Deleted

## 2019-02-03 ENCOUNTER — Ambulatory Visit (INDEPENDENT_AMBULATORY_CARE_PROVIDER_SITE_OTHER): Payer: Medicare HMO | Admitting: *Deleted

## 2019-02-03 VITALS — Ht 64.0 in | Wt 186.0 lb

## 2019-02-03 DIAGNOSIS — M1712 Unilateral primary osteoarthritis, left knee: Secondary | ICD-10-CM | POA: Diagnosis not present

## 2019-02-03 DIAGNOSIS — Z0000 Encounter for general adult medical examination without abnormal findings: Secondary | ICD-10-CM | POA: Diagnosis not present
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
## 2019-02-03 NOTE — Patient Instructions (Signed)
Kimberly [name] , Thank you for taking time to come for your Medicare Wellness Visit. I appreciate your ongoing commitment to your health goals. Please review the following plan we discussed and let me know if I can assist you in the future.   These are the goals we discussed: Goals    . Exercise 3x per week (30 min per time)     Try to exercise for at least 30 minutes, 3 time weekly    . Have 3 meals a day     Try to eat 3 meals daily that consist of lean proteins, fruits and vegetables       This is a list of the screening recommended for you and due dates:  Health Maintenance  Topic Date Due  .  Hepatitis C: One time screening is recommended by Center for Disease Control  (CDC) for  adults born from [birth year] through 1965.   Oct 17, 1950  . Tetanus Vaccine  09/06/1969  . Colon Cancer Screening  09/06/2000  . Pneumonia vaccines (1 of 2 - PCV13) 09/07/2015  . Mammogram  12/14/2015  . Flu Shot  02/19/2019  . DEXA scan (bone density measurement)  Completed     BMI for Adults  Body mass index (BMI) is a number that is calculated from a person's weight and height. BMI may help to estimate how much of a person's weight is composed of fat. BMI can help identify those who may be at higher risk for certain medical problems. How is BMI used with adults? BMI is used as a screening tool to identify possible weight problems. It is used to check whether a person is obese, overweight, healthy weight, or underweight. How is BMI calculated? BMI measures your weight and compares it to your height. This can be done either in [place] ([place] or metric measurements. Note that charts are available to help you find your BMI quickly and easily without having to do these calculations yourself. To calculate your BMI in [place] ([place] measurements, your health care provider will: 1. Measure your weight in pounds (lb). 2. Multiply the number of pounds by 703. ? For example, for a person who weighs 180 lb, multiply  that number by 703, which equals 126,540. 3. Measure your height in inches (in). Then multiply that number by itself to get a measurement called "inches squared." ? For example, for a person who is 70 in tall, the "inches squared" measurement is 70 in x 70 in, which equals 4900 inches squared. 4. Divide the total from Step 2 (number of lb x 703) by the total from Step 3 (inches squared): 126,540  4900 = 25.8. This is your BMI. To calculate your BMI in metric measurements, your health care provider will: 1. Measure your weight in kilograms (kg). 2. Measure your height in meters (m). Then multiply that number by itself to get a measurement called "meters squared." ? For example, for a person who is 1.75 m tall, the "meters squared" measurement is 1.75 m x 1.75 m, which is equal to 3.1 meters squared. 3. Divide the number of kilograms (your weight) by the meters squared number. In this example: 70  3.1 = 22.6. This is your BMI. How is BMI interpreted? To interpret your results, your health care provider will use BMI charts to identify whether you are underweight, normal weight, overweight, or obese. The following guidelines will be used:  Underweight: BMI less than 18.5.  Normal weight: BMI between 18.5 and 24.9.  Overweight: BMI  between 25 and 29.9.  Obese: BMI of 30 and above. Please note:  Weight includes both fat and muscle, so someone with a muscular build, such as an athlete, may have a BMI that is higher than 24.9. In cases like these, BMI is not an accurate measure of body fat.  To determine if excess body fat is the cause of a BMI of 25 or higher, further assessments may need to be done by a health care provider.  BMI is usually interpreted in the same way for men and women. Why is BMI a useful tool? BMI is useful in two ways:  Identifying a weight problem that may be related to a medical condition, or that may increase the risk for medical problems.  Promoting lifestyle and  diet changes in order to reach a healthy weight. Summary  Body mass index (BMI) is a number that is calculated from a person's weight and height.  BMI may help to estimate how much of a person's weight is composed of fat. BMI can help identify those who may be at higher risk for certain medical problems.  BMI can be measured using English measurements or metric measurements.  To interpret your results, your health care provider will use BMI charts to identify whether you are underweight, normal weight, overweight, or obese. This information is not intended to replace advice given to you by your health care provider. Make sure you discuss any questions you have with your health care provider. Document Released: 03/18/2004 Document Revised: 06/19/2017 Document Reviewed: 05/20/2017 Elsevier Patient Education  2020 [place] Directive  Advance directives are legal documents that let you make choices ahead of time about your health care and medical treatment in case you become unable to communicate for yourself. Advance directives are a way for you to communicate your wishes to family, friends, and health care providers. This can help convey your decisions about end-of-life care if you become unable to communicate. Discussing and writing advance directives should happen over time rather than all at once. Advance directives can be changed depending on your situation and what you want, even after you have signed the advance directives. If you do not have an advance directive, some states assign family decision makers to act on your behalf based on how closely you are related to them. Each state has its own laws regarding advance directives. You may want to check with your health care provider, attorney, or state representative about the laws in your state. There are different types of advance directives, such as:  Medical power of attorney.  Living will.  Do not resuscitate (DNR)  or do not attempt resuscitation (DNAR) order. Health care proxy and medical power of attorney A health care proxy, also called a health care agent, is a person who is appointed to make medical decisions for you in cases in which you are unable to make the decisions yourself. Generally, people choose someone they know well and trust to represent their preferences. Make sure to ask this person for an agreement to act as your proxy. A proxy may have to exercise judgment in the event of a medical decision for which your wishes are not known. A medical power of attorney is a legal document that names your health care proxy. Depending on the laws in your state, after the document is written, it may also need to be:  Signed.  Notarized.  Dated.  Copied.  Witnessed.  Incorporated into your medical record.  You may also want to appoint someone to manage your financial affairs in a situation in which you are unable to do so. This is called a durable power of attorney for finances. It is a separate legal document from the durable power of attorney for health care. You may choose the same person or someone different from your health care proxy to act as your agent in financial matters. If you do not appoint a proxy, or if there is a concern that the proxy is not acting in your best interests, a court-appointed guardian may be designated to act on your behalf. Living will A living will is a set of instructions documenting your wishes about medical care when you cannot express them yourself. Health care providers should keep a copy of your living will in your medical record. You may want to give a copy to family members or friends. To alert caregivers in case of an emergency, you can place a card in your wallet to let them know that you have a living will and where they can find it. A living will is used if you become:  Terminally ill.  Incapacitated.  Unable to communicate or make decisions. Items to  consider in your living will include:  The use or non-use of life-sustaining equipment, such as dialysis machines and breathing machines (ventilators).  A DNR or DNAR order, which is the instruction not to use cardiopulmonary resuscitation (CPR) if breathing or heartbeat stops.  The use or non-use of tube feeding.  Withholding of food and fluids.  Comfort (palliative) care when the goal becomes comfort rather than a cure.  Organ and tissue donation. A living will does not give instructions for distributing your money and property if you should pass away. It is recommended that you seek the advice of a lawyer when writing a will. Decisions about taxes, beneficiaries, and asset distribution will be legally binding. This process can relieve your family and friends of any concerns surrounding disputes or questions that may come up about the distribution of your assets. DNR or DNAR A DNR or DNAR order is a request not to have CPR in the event that your heart stops beating or you stop breathing. If a DNR or DNAR order has not been made and shared, a health care provider will try to help any patient whose heart has stopped or who has stopped breathing. If you plan to have surgery, talk with your health care provider about how your DNR or DNAR order will be followed if problems occur. Summary  Advance directives are the legal documents that allow you to make choices ahead of time about your health care and medical treatment in case you become unable to communicate for yourself.  The process of discussing and writing advance directives should happen over time. You can change the advance directives, even after you have signed them.  Advance directives include DNR or DNAR orders, living wills, and designating an agent as your medical power of attorney. This information is not intended to replace advice given to you by your health care provider. Make sure you discuss any questions you have with your  health care provider. Document Released: 10/14/2007 Document Revised: 08/11/2018 Document Reviewed: 05/26/2016 Elsevier Patient Education  2020 [employer].

## 2019-02-09 DIAGNOSIS — S83512S Sprain of anterior cruciate ligament of left knee, sequela: Secondary | ICD-10-CM | POA: Diagnosis not present

## 2019-02-10 DIAGNOSIS — S83512S Sprain of anterior cruciate ligament of left knee, sequela: Secondary | ICD-10-CM | POA: Diagnosis not present

## 2019-02-11 ENCOUNTER — Other Ambulatory Visit: Payer: Medicare HMO

## 2019-02-21 DIAGNOSIS — S83512S Sprain of anterior cruciate ligament of left knee, sequela: Secondary | ICD-10-CM | POA: Diagnosis not present

## 2019-02-23 DIAGNOSIS — S83512S Sprain of anterior cruciate ligament of left knee, sequela: Secondary | ICD-10-CM | POA: Diagnosis not present

## 2019-02-24 DIAGNOSIS — M25562 Pain in left knee: Secondary | ICD-10-CM | POA: Diagnosis not present

## 2019-02-25 DIAGNOSIS — S83512S Sprain of anterior cruciate ligament of left knee, sequela: Secondary | ICD-10-CM | POA: Diagnosis not present

## 2019-02-28 DIAGNOSIS — S83512S Sprain of anterior cruciate ligament of left knee, sequela: Secondary | ICD-10-CM | POA: Diagnosis not present

## 2019-03-03 DIAGNOSIS — S83512S Sprain of anterior cruciate ligament of left knee, sequela: Secondary | ICD-10-CM | POA: Diagnosis not present

## 2019-03-08 DIAGNOSIS — S83512S Sprain of anterior cruciate ligament of left knee, sequela: Secondary | ICD-10-CM | POA: Diagnosis not present

## 2019-03-10 DIAGNOSIS — S83512S Sprain of anterior cruciate ligament of left knee, sequela: Secondary | ICD-10-CM | POA: Diagnosis not present

## 2019-03-14 DIAGNOSIS — S83512S Sprain of anterior cruciate ligament of left knee, sequela: Secondary | ICD-10-CM | POA: Diagnosis not present

## 2019-03-17 DIAGNOSIS — S83512S Sprain of anterior cruciate ligament of left knee, sequela: Secondary | ICD-10-CM | POA: Diagnosis not present

## 2019-03-22 DIAGNOSIS — S83512S Sprain of anterior cruciate ligament of left knee, sequela: Secondary | ICD-10-CM | POA: Diagnosis not present

## 2019-03-31 DIAGNOSIS — S83512S Sprain of anterior cruciate ligament of left knee, sequela: Secondary | ICD-10-CM | POA: Diagnosis not present

## 2019-04-05 DIAGNOSIS — S83512S Sprain of anterior cruciate ligament of left knee, sequela: Secondary | ICD-10-CM | POA: Diagnosis not present

## 2019-06-03 ENCOUNTER — Other Ambulatory Visit: Payer: Self-pay

## 2019-06-06 ENCOUNTER — Encounter: Payer: Self-pay | Admitting: Family Medicine

## 2019-06-06 ENCOUNTER — Other Ambulatory Visit: Payer: Self-pay

## 2019-06-06 ENCOUNTER — Ambulatory Visit (INDEPENDENT_AMBULATORY_CARE_PROVIDER_SITE_OTHER): Payer: Medicare HMO | Admitting: Family Medicine

## 2019-06-06 VITALS — BP 101/70 | HR 69 | Temp 97.7°F | Ht 64.0 in | Wt 187.0 lb

## 2019-06-06 DIAGNOSIS — M62830 Muscle spasm of back: Secondary | ICD-10-CM | POA: Diagnosis not present

## 2019-06-06 NOTE — Patient Instructions (Addendum)
Make sure that when you do take the Ibuprofen, you take it totally separately from your celexa.  Together they can increase risk of GI bleeding.  Use heat.  Low Back Sprain or Strain Rehab Ask your health care provider which exercises are safe for you. Do exercises exactly as told by your health care provider and adjust them as directed. It is normal to feel mild stretching, pulling, tightness, or discomfort as you do these exercises. Stop right away if you feel sudden pain or your pain gets worse. Do not begin these exercises until told by your health care provider. Stretching and range-of-motion exercises These exercises warm up your muscles and joints and improve the movement and flexibility of your back. These exercises also help to relieve pain, numbness, and tingling. Lumbar rotation  1. Lie on your back on a firm surface and bend your knees. 2. Straighten your arms out to your sides so each arm forms a 90-degree angle (right angle) with a side of your body. 3. Slowly move (rotate) both of your knees to one side of your body until you feel a stretch in your lower back (lumbar). Try not to let your shoulders lift off the floor. 4. Hold this position for __________ seconds. 5. Tense your abdominal muscles and slowly move your knees back to the starting position. 6. Repeat this exercise on the other side of your body. Repeat __________ times. Complete this exercise __________ times a day. Single knee to chest  1. Lie on your back on a firm surface with both legs straight. 2. Bend one of your knees. Use your hands to move your knee up toward your chest until you feel a gentle stretch in your lower back and buttock. ? Hold your leg in this position by holding on to the front of your knee. ? Keep your other leg as straight as possible. 3. Hold this position for __________ seconds. 4. Slowly return to the starting position. 5. Repeat with your other leg. Repeat __________ times. Complete  this exercise __________ times a day. Prone extension on elbows  1. Lie on your abdomen on a firm surface (prone position). 2. Prop yourself up on your elbows. 3. Use your arms to help lift your chest up until you feel a gentle stretch in your abdomen and your lower back. ? This will place some of your body weight on your elbows. If this is uncomfortable, try stacking pillows under your chest. ? Your hips should stay down, against the surface that you are lying on. Keep your hip and back muscles relaxed. 4. Hold this position for __________ seconds. 5. Slowly relax your upper body and return to the starting position. Repeat __________ times. Complete this exercise __________ times a day. Strengthening exercises These exercises build strength and endurance in your back. Endurance is the ability to use your muscles for a long time, even after they get tired. Pelvic tilt This exercise strengthens the muscles that lie deep in the abdomen. 1. Lie on your back on a firm surface. Bend your knees and keep your feet flat on the floor. 2. Tense your abdominal muscles. Tip your pelvis up toward the ceiling and flatten your lower back into the floor. ? To help with this exercise, you may place a small towel under your lower back and try to push your back into the towel. 3. Hold this position for __________ seconds. 4. Let your muscles relax completely before you repeat this exercise. Repeat __________ times. Complete this  exercise __________ times a day. Alternating arm and leg raises  1. Get on your hands and knees on a firm surface. If you are on a hard floor, you may want to use padding, such as an exercise mat, to cushion your knees. 2. Line up your arms and legs. Your hands should be directly below your shoulders, and your knees should be directly below your hips. 3. Lift your left leg behind you. At the same time, raise your right arm and straighten it in front of you. ? Do not lift your leg  higher than your hip. ? Do not lift your arm higher than your shoulder. ? Keep your abdominal and back muscles tight. ? Keep your hips facing the ground. ? Do not arch your back. ? Keep your balance carefully, and do not hold your breath. 4. Hold this position for __________ seconds. 5. Slowly return to the starting position. 6. Repeat with your right leg and your left arm. Repeat __________ times. Complete this exercise __________ times a day. Abdominal set with straight leg raise  1. Lie on your back on a firm surface. 2. Bend one of your knees and keep your other leg straight. 3. Tense your abdominal muscles and lift your straight leg up, 4-6 inches (10-15 cm) off the ground. 4. Keep your abdominal muscles tight and hold this position for __________ seconds. ? Do not hold your breath. ? Do not arch your back. Keep it flat against the ground. 5. Keep your abdominal muscles tense as you slowly lower your leg back to the starting position. 6. Repeat with your other leg. Repeat __________ times. Complete this exercise __________ times a day. Single leg lower with bent knees 1. Lie on your back on a firm surface. 2. Tense your abdominal muscles and lift your feet off the floor, one foot at a time, so your knees and hips are bent in 90-degree angles (right angles). ? Your knees should be over your hips and your lower legs should be parallel to the floor. 3. Keeping your abdominal muscles tense and your knee bent, slowly lower one of your legs so your toe touches the ground. 4. Lift your leg back up to return to the starting position. ? Do not hold your breath. ? Do not let your back arch. Keep your back flat against the ground. 5. Repeat with your other leg. Repeat __________ times. Complete this exercise __________ times a day. Posture and body mechanics Good posture and healthy body mechanics can help to relieve stress in your body's tissues and joints. Body mechanics refers to the  movements and positions of your body while you do your daily activities. Posture is part of body mechanics. Good posture means:  Your spine is in its natural S-curve position (neutral).  Your shoulders are pulled back slightly.  Your head is not tipped forward. Follow these guidelines to improve your posture and body mechanics in your everyday activities. Standing   When standing, keep your spine neutral and your feet about hip width apart. Keep a slight bend in your knees. Your ears, shoulders, and hips should line up.  When you do a task in which you stand in one place for a long time, place one foot up on a stable object that is 2-4 inches (5-10 cm) high, such as a footstool. This helps keep your spine neutral. Sitting   When sitting, keep your spine neutral and keep your feet flat on the floor. Use a footrest, if necessary,  and keep your thighs parallel to the floor. Avoid rounding your shoulders, and avoid tilting your head forward.  When working at a desk or a computer, keep your desk at a height where your hands are slightly lower than your elbows. Slide your chair under your desk so you are close enough to maintain good posture.  When working at a computer, place your monitor at a height where you are looking straight ahead and you do not have to tilt your head forward or downward to look at the screen. Resting  When lying down and resting, avoid positions that are most painful for you.  If you have pain with activities such as sitting, bending, stooping, or squatting, lie in a position in which your body does not bend very much. For example, avoid curling up on your side with your arms and knees near your chest (fetal position).  If you have pain with activities such as standing for a long time or reaching with your arms, lie with your spine in a neutral position and bend your knees slightly. Try the following positions: ? Lying on your side with a pillow between your knees. ?  Lying on your back with a pillow under your knees. Lifting   When lifting objects, keep your feet at least shoulder width apart and tighten your abdominal muscles.  Bend your knees and hips and keep your spine neutral. It is important to lift using the strength of your legs, not your back. Do not lock your knees straight out.  Always ask for help to lift heavy or awkward objects. This information is not intended to replace advice given to you by your health care provider. Make sure you discuss any questions you have with your health care provider. Document Released: 07/07/2005 Document Revised: 10/29/2018 Document Reviewed: 07/29/2018 Elsevier Patient Education  2020 [employer].
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
## 2019-12-20 ENCOUNTER — Other Ambulatory Visit: Payer: Self-pay

## 2019-12-20 DIAGNOSIS — F339 Major depressive disorder, recurrent, unspecified: Secondary | ICD-10-CM

## 2019-12-20 MED ORDER — CITALOPRAM HYDROBROMIDE 10 MG PO TABS: 10.0000 mg | ORAL_TABLET | Freq: Every day | ORAL | 0 refills | Status: DC

## 2020-01-11 ENCOUNTER — Other Ambulatory Visit: Payer: Self-pay | Admitting: Family Medicine

## 2020-01-11 DIAGNOSIS — F339 Major depressive disorder, recurrent, unspecified: Secondary | ICD-10-CM

## 2020-01-11 NOTE — Telephone Encounter (Signed)
[facility] NTBS 30 days given 12/20/19

## 2020-01-12 ENCOUNTER — Other Ambulatory Visit: Payer: Self-pay | Admitting: Family Medicine

## 2020-01-12 DIAGNOSIS — F339 Major depressive disorder, recurrent, unspecified: Secondary | ICD-10-CM

## 2020-02-03 ENCOUNTER — Other Ambulatory Visit: Payer: Self-pay | Admitting: Family Medicine

## 2020-02-03 DIAGNOSIS — F339 Major depressive disorder, recurrent, unspecified: Secondary | ICD-10-CM

## 2020-02-03 MED ORDER — CITALOPRAM HYDROBROMIDE 10 MG PO TABS: 10.0000 mg | ORAL_TABLET | Freq: Every day | ORAL | 0 refills | Status: DC

## 2020-02-03 NOTE — Telephone Encounter (Signed)
Patient is in need of refill of her Celexa.  Only has 4 tabs left.  Made appt with Dr. Darnell Level for 8/2.  Is there anyway she can get a few tabs until her appt? [facility]

## 2020-02-20 ENCOUNTER — Encounter: Payer: Self-pay | Admitting: Family Medicine

## 2020-02-20 ENCOUNTER — Other Ambulatory Visit: Payer: Self-pay

## 2020-02-20 ENCOUNTER — Ambulatory Visit (INDEPENDENT_AMBULATORY_CARE_PROVIDER_SITE_OTHER): Payer: Medicare Other | Admitting: Family Medicine

## 2020-02-20 VITALS — BP 129/66 | HR 63 | Temp 97.4°F | Ht 64.0 in | Wt 189.8 lb

## 2020-02-20 DIAGNOSIS — F3342 Major depressive disorder, recurrent, in full remission: Secondary | ICD-10-CM

## 2020-02-20 DIAGNOSIS — Z13220 Encounter for screening for lipoid disorders: Secondary | ICD-10-CM | POA: Diagnosis not present

## 2020-02-20 DIAGNOSIS — M8580 Other specified disorders of bone density and structure, unspecified site: Secondary | ICD-10-CM | POA: Diagnosis not present

## 2020-02-20 DIAGNOSIS — Z136 Encounter for screening for cardiovascular disorders: Secondary | ICD-10-CM | POA: Diagnosis not present

## 2020-02-20 DIAGNOSIS — I8393 Asymptomatic varicose veins of bilateral lower extremities: Secondary | ICD-10-CM

## 2020-02-20 DIAGNOSIS — E559 Vitamin D deficiency, unspecified: Secondary | ICD-10-CM | POA: Diagnosis not present

## 2020-02-20 DIAGNOSIS — R0989 Other specified symptoms and signs involving the circulatory and respiratory systems: Secondary | ICD-10-CM

## 2020-02-20 DIAGNOSIS — Z7689 Persons encountering health services in other specified circumstances: Secondary | ICD-10-CM

## 2020-02-20 MED ORDER — CITALOPRAM HYDROBROMIDE 10 MG PO TABS: 10.0000 mg | ORAL_TABLET | Freq: Every day | ORAL | 3 refills | Status: DC
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
## 2020-02-20 NOTE — Patient Instructions (Signed)
Varicose Veins Varicose veins are veins that have become enlarged, bulged, and twisted. They most often appear in the legs. What are the causes? This condition is caused by damage to the valves in the vein. These valves help blood return to your heart. When they are damaged and they stop working properly, blood may flow backward and back up in the veins near the skin, causing the veins to get larger and appear twisted. The condition can result from any issue that causes blood to back up, like pregnancy, prolonged standing, or obesity. What increases the risk? This condition is more likely to develop in people who are:  On their feet a lot.  Pregnant.  Overweight. What are the signs or symptoms? Symptoms of this condition include:  Bulging, twisted, and bluish veins.  A feeling of heaviness. This may be worse at the end of the day.  Leg pain. This may be worse at the end of the day.  Swelling in the leg.  Changes in skin color over the veins. How is this diagnosed? This condition may be diagnosed based on your symptoms, a physical exam, and an ultrasound test. How is this treated? Treatment for this condition may involve:  Avoiding sitting or standing in one position for long periods of time.  Wearing compression stockings. These stockings help to prevent blood clots and reduce swelling in the legs.  Raising (elevating) the legs when resting.  Losing weight.  Exercising regularly. If you have persistent symptoms or want to improve the way your varicose veins look, you may choose to have a procedure to close the varicose veins off or to remove them. Treatments to close off the veins include:  Sclerotherapy. In this treatment, a solution is injected into a vein to close it off.  Laser treatment. In this treatment, the vein is heated with a laser to close it off.  Radiofrequency vein ablation. In this treatment, an electrical current produced by radio waves is used to close  off the vein. Treatments to remove the veins include:  Phlebectomy. In this treatment, the veins are removed through small incisions made over the veins.  Vein ligation and stripping. In this treatment, incisions are made over the veins. The veins are then removed after being tied (ligated) with stitches (sutures). Follow these instructions at home: Activity  Walk as much as possible. Walking increases blood flow. This helps blood return to the heart and takes pressure off your veins. It also increases your cardiovascular strength.  Follow your health care provider's instructions about exercising.  Do not stand or sit in one position for a long period of time.  Do not sit with your legs crossed.  Rest with your legs raised during the day. General instructions   Follow any diet instructions given to you by your health care provider.  Wear compression stockings as directed by your health care provider. Do not wear other kinds of tight clothing around your legs, pelvis, or waist.  Elevate your legs at night to above the level of your heart.  If you get a cut in the skin over the varicose vein and the vein bleeds: ? Lie down with your leg raised. ? Apply firm pressure to the cut with a clean cloth until the bleeding stops. ? Place a bandage (dressing) on the cut. Contact a health care provider if:  The skin around your varicose veins starts to break down.  You have pain, redness, tenderness, or hard swelling over a vein.  You   are uncomfortable because of pain.  You get a cut in the skin over a varicose vein and it will not stop bleeding. Summary  Varicose veins are veins that have become enlarged, bulged, and twisted. They most often appear in the legs.  This condition is caused by damage to the valves in the vein. These valves help blood return to your heart.  Treatment for this condition includes frequent movements, wearing compression stockings, losing weight, and  exercising regularly. In some cases, procedures are done to close off or remove the veins.  Treatment for this condition may include wearing compression stockings, elevating the legs, losing weight, and engaging in regular activity. In some cases, procedures are done to close off or remove the veins. This information is not intended to replace advice given to you by your health care provider. Make sure you discuss any questions you have with your health care provider. Document Revised: 09/02/2018 Document Reviewed: 07/30/2016 Elsevier Patient Education  2020 [employer]  

## 2020-02-21 ENCOUNTER — Ambulatory Visit (INDEPENDENT_AMBULATORY_CARE_PROVIDER_SITE_OTHER): Payer: Medicare Other

## 2020-02-21 ENCOUNTER — Other Ambulatory Visit: Payer: Self-pay | Admitting: Family Medicine

## 2020-02-21 DIAGNOSIS — Z0000 Encounter for general adult medical examination without abnormal findings: Secondary | ICD-10-CM | POA: Diagnosis not present

## 2020-02-21 LAB — LIPID PANEL
Chol/HDL Ratio: 2.7 ratio (ref 0.0–4.4)
Cholesterol, Total: 263 mg/dL — ABNORMAL HIGH (ref 100–199)
HDL: 99 mg/dL (ref >39)
LDL Chol Calc (NIH): 151 mg/dL — ABNORMAL HIGH (ref 0–99)
Triglycerides: 79 mg/dL (ref 0–149)
VLDL Cholesterol Cal: 13 mg/dL (ref 5–40)

## 2020-02-21 LAB — CMP14+EGFR
ALT: 9 IU/L (ref 0–32)
AST: 15 IU/L (ref 0–40)
Albumin/Globulin Ratio: 1.6 (ref 1.2–2.2)
Albumin: 4 g/dL (ref 3.8–4.8)
Alkaline Phosphatase: 111 IU/L (ref 48–121)
BUN/Creatinine Ratio: 14 (ref 12–28)
BUN: 13 mg/dL (ref 8–27)
Bilirubin Total: 0.3 mg/dL (ref 0.0–1.2)
CO2: 24 mmol/L (ref 20–29)
Calcium: 9 mg/dL (ref 8.7–10.3)
Chloride: 105 mmol/L (ref 96–106)
Creatinine, Ser: 0.94 mg/dL (ref 0.57–1.00)
GFR calc Af Amer: 72 mL/min/{1.73_m2} (ref >59)
GFR calc non Af Amer: 62 mL/min/{1.73_m2} (ref >59)
Globulin, Total: 2.5 g/dL (ref 1.5–4.5)
Glucose: 83 mg/dL (ref 65–99)
Potassium: 5.1 mmol/L (ref 3.5–5.2)
Sodium: 140 mmol/L (ref 134–144)
Total Protein: 6.5 g/dL (ref 6.0–8.5)

## 2020-02-21 LAB — VITAMIN D 25 HYDROXY (VIT D DEFICIENCY, FRACTURES): Vit D, 25-Hydroxy: 15.6 ng/mL — ABNORMAL LOW (ref 30.0–100.0)

## 2020-02-21 LAB — TSH: TSH: 4.78 u[IU]/mL — ABNORMAL HIGH (ref 0.450–4.500)

## 2020-02-21 MED ORDER — VITAMIN D (ERGOCALCIFEROL) 1.25 MG (50000 UNIT) PO CAPS: 50000.0000 [IU] | ORAL_CAPSULE | ORAL | 0 refills | Status: AC

## 2020-02-21 NOTE — Patient Instructions (Signed)
  [facility] Maintenance Summary and Written Plan of Care  Ms. Bontempo ,  Thank you for allowing me to perform your Medicare Annual Wellness Visit and for your ongoing commitment to your health.   Health Maintenance & Immunization History Health Maintenance  Topic Date Due  . Hepatitis C Screening  Never done  . TETANUS/TDAP  Never done  . COLONOSCOPY  Never done  . PNA vac Low Risk Adult (1 of 2 - PCV13) Never done  . MAMMOGRAM  12/14/2015  . INFLUENZA VACCINE  02/19/2020  . DEXA SCAN  Completed  . COVID-19 Vaccine  Completed   Immunization History  Administered Date(s) Administered  . Influenza, High Dose Seasonal PF 06/10/2017  . Influenza,inj,Quad PF,6+ Mos 06/21/2018  . Moderna SARS-COVID-2 Vaccination 09/14/2019, 10/12/2019    These are the patient goals that we discussed: Goals Addressed            This Visit's Progress   . Exercise 3x per week (30 min per time)   On track    Try to exercise for at least 30 minutes, 3 time weekly    . Have 3 meals a day   On track    Try to eat 3 meals daily that consist of lean proteins, fruits and vegetables        This is a list of Health Maintenance Items that are overdue or due now: Health Maintenance Due  Topic Date Due  . Hepatitis C Screening  Never done  . TETANUS/TDAP  Never done  . COLONOSCOPY  Never done  . PNA vac Low Risk Adult (1 of 2 - PCV13) Never done  . MAMMOGRAM  12/14/2015  . INFLUENZA VACCINE  02/19/2020     Orders/Referrals Placed Today: No orders of the defined types were placed in this encounter.  (Contact our referral department at [phone] if you have not spoken with someone about your referral appointment within the next 5 days)    Follow-up Plan  As needed with Dr. Lajuana Ripple
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
## 2020-02-22 MED ORDER — ATORVASTATIN CALCIUM 10 MG PO TABS: 10.0000 mg | ORAL_TABLET | Freq: Every day | ORAL | 3 refills | Status: DC

## 2020-02-22 NOTE — Addendum Note (Signed)
Addended by: Ladean Raya on: 02/22/2020 08:39 AM   Modules accepted: Orders

## 2020-04-09 NOTE — Progress Notes (Signed)
Patient location: Home  Provider location: [place]

## 2020-05-09 ENCOUNTER — Other Ambulatory Visit: Payer: Self-pay | Admitting: Family Medicine

## 2020-05-25 ENCOUNTER — Encounter: Payer: Self-pay | Admitting: Family Medicine

## 2020-05-25 ENCOUNTER — Other Ambulatory Visit: Payer: Self-pay

## 2020-05-25 ENCOUNTER — Ambulatory Visit (INDEPENDENT_AMBULATORY_CARE_PROVIDER_SITE_OTHER): Payer: Medicare Other | Admitting: Family Medicine

## 2020-05-25 VITALS — BP 126/82 | HR 68 | Temp 97.6°F | Ht 64.0 in | Wt 187.0 lb

## 2020-05-25 DIAGNOSIS — R7989 Other specified abnormal findings of blood chemistry: Secondary | ICD-10-CM | POA: Diagnosis not present

## 2020-05-25 DIAGNOSIS — E7800 Pure hypercholesterolemia, unspecified: Secondary | ICD-10-CM

## 2020-05-25 DIAGNOSIS — Z23 Encounter for immunization: Secondary | ICD-10-CM

## 2020-05-25 DIAGNOSIS — R238 Other skin changes: Secondary | ICD-10-CM | POA: Diagnosis not present

## 2020-05-25 DIAGNOSIS — E559 Vitamin D deficiency, unspecified: Secondary | ICD-10-CM | POA: Diagnosis not present

## 2020-05-25 DIAGNOSIS — R233 Spontaneous ecchymoses: Secondary | ICD-10-CM

## 2020-05-25 DIAGNOSIS — S83512S Sprain of anterior cruciate ligament of left knee, sequela: Secondary | ICD-10-CM

## 2020-05-25 LAB — CMP14+EGFR
BUN: 14 mg/dL (ref 8–27)
Creatinine, Ser: 0.81 mg/dL (ref 0.57–1.00)

## 2020-05-25 NOTE — Patient Instructions (Addendum)
You had labs performed today.  You will be contacted with the results of the labs once they are available, usually in the next 3 business days for routine lab work.  If you have an active my chart account, they will be released to your MyChart.  If you prefer to have these labs released to you via telephone, please let us know.  If you had a pap smear or biopsy performed, expect to be contacted in about 7-10 days.  Consider follow up with ortho  Easy bruising can occur in 1 in 1000 people with your cholesterol medication
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
## 2020-05-26 LAB — CMP14+EGFR
ALT: 11 IU/L (ref 0–32)
AST: 19 IU/L (ref 0–40)
Albumin/Globulin Ratio: 1.9 (ref 1.2–2.2)
Albumin: 4.1 g/dL (ref 3.8–4.8)
Alkaline Phosphatase: 111 IU/L (ref 44–121)
BUN/Creatinine Ratio: 17 (ref 12–28)
Bilirubin Total: 0.3 mg/dL (ref 0.0–1.2)
CO2: 24 mmol/L (ref 20–29)
Calcium: 9.3 mg/dL (ref 8.7–10.3)
Chloride: 106 mmol/L (ref 96–106)
GFR calc Af Amer: 86 mL/min/{1.73_m2} (ref >59)
GFR calc non Af Amer: 74 mL/min/{1.73_m2} (ref >59)
Globulin, Total: 2.2 g/dL (ref 1.5–4.5)
Glucose: 80 mg/dL (ref 65–99)
Potassium: 4.7 mmol/L (ref 3.5–5.2)
Sodium: 145 mmol/L — ABNORMAL HIGH (ref 134–144)
Total Protein: 6.3 g/dL (ref 6.0–8.5)

## 2020-05-26 LAB — THYROID PANEL WITH TSH
Free Thyroxine Index: 1.6 (ref 1.2–4.9)
T3 Uptake Ratio: 23 % — ABNORMAL LOW (ref 24–39)
T4, Total: 6.9 ug/dL (ref 4.5–12.0)
TSH: 5.31 u[IU]/mL — ABNORMAL HIGH (ref 0.450–4.500)

## 2020-05-26 LAB — CBC
Hematocrit: 38.4 % (ref 34.0–46.6)
Hemoglobin: 12.7 g/dL (ref 11.1–15.9)
MCH: 30 pg (ref 26.6–33.0)
MCHC: 33.1 g/dL (ref 31.5–35.7)
MCV: 91 fL (ref 79–97)
Platelets: 245 10*3/uL (ref 150–450)
RBC: 4.23 x10E6/uL (ref 3.77–5.28)
RDW: 12.7 % (ref 11.7–15.4)
WBC: 4.5 10*3/uL (ref 3.4–10.8)

## 2020-05-26 LAB — LIPID PANEL
Chol/HDL Ratio: 1.9 ratio (ref 0.0–4.4)
Cholesterol, Total: 186 mg/dL (ref 100–199)
HDL: 100 mg/dL (ref >39)
LDL Chol Calc (NIH): 76 mg/dL (ref 0–99)
Triglycerides: 49 mg/dL (ref 0–149)
VLDL Cholesterol Cal: 10 mg/dL (ref 5–40)

## 2020-05-26 LAB — VITAMIN D 25 HYDROXY (VIT D DEFICIENCY, FRACTURES): Vit D, 25-Hydroxy: 36.9 ng/mL (ref 30.0–100.0)

## 2020-05-29 ENCOUNTER — Other Ambulatory Visit: Payer: Self-pay | Admitting: Family Medicine

## 2020-05-29 MED ORDER — LEVOTHYROXINE SODIUM 25 MCG PO TABS: 25.0000 ug | ORAL_TABLET | Freq: Every day | ORAL | 0 refills | Status: DC

## 2020-05-31 ENCOUNTER — Telehealth: Payer: Self-pay

## 2020-05-31 NOTE — Telephone Encounter (Signed)
Patient aware of lab results.

## 2020-08-22 ENCOUNTER — Other Ambulatory Visit: Payer: Self-pay | Admitting: Family Medicine

## 2020-08-27 ENCOUNTER — Ambulatory Visit: Admitting: Family Medicine
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
## 2020-10-07 ENCOUNTER — Encounter: Payer: Self-pay | Admitting: Family Medicine

## 2020-10-08 ENCOUNTER — Ambulatory Visit (INDEPENDENT_AMBULATORY_CARE_PROVIDER_SITE_OTHER): Payer: Medicare Other | Admitting: Family Medicine

## 2020-10-08 ENCOUNTER — Telehealth: Payer: Self-pay | Admitting: Family Medicine

## 2020-10-08 DIAGNOSIS — R7989 Other specified abnormal findings of blood chemistry: Secondary | ICD-10-CM

## 2020-10-08 DIAGNOSIS — U071 COVID-19: Secondary | ICD-10-CM | POA: Diagnosis not present

## 2020-10-08 MED ORDER — MOLNUPIRAVIR 200 MG PO CAPS: 4.0000 | ORAL_CAPSULE | Freq: Two times a day (BID) | ORAL | 0 refills | Status: AC

## 2020-10-08 MED ORDER — BENZONATATE 100 MG PO CAPS: 100.0000 mg | ORAL_CAPSULE | Freq: Three times a day (TID) | ORAL | 0 refills | Status: DC | PRN

## 2020-10-08 MED ORDER — MAGIC MOUTHWASH W/LIDOCAINE: ORAL | 0 refills | Status: DC

## 2020-10-08 NOTE — Telephone Encounter (Signed)
Pt aware refill was sent to pharmacy on 08/22/20, also labwork placed for future repeat of thyroid bloodwork
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
## 2020-10-08 NOTE — Telephone Encounter (Signed)
  Prescription Request  10/08/2020  What is the name of the medication or equipment? Levothyroxine  Have you contacted your pharmacy to request a refill? (if applicable) yes  Which pharmacy would you like this sent to? [facility] in [place]   Patient notified that their request is being sent to the clinical staff for review and that they should receive a response within 2 business days.

## 2020-10-15 ENCOUNTER — Ambulatory Visit (INDEPENDENT_AMBULATORY_CARE_PROVIDER_SITE_OTHER): Payer: Medicare Other | Admitting: Family Medicine

## 2020-10-15 ENCOUNTER — Telehealth: Payer: Self-pay

## 2020-10-15 DIAGNOSIS — U071 COVID-19: Secondary | ICD-10-CM

## 2020-10-15 NOTE — Telephone Encounter (Signed)
Pt says that [facility] not have rx for levothyroxine (SYNTHROID) 25 MCG tablet. Please call back when sent in again

## 2020-10-15 NOTE — Telephone Encounter (Signed)
Patient aware rx was sent last month for patient - she will contact pharmacy.

## 2020-10-15 NOTE — Progress Notes (Signed)
    Subjective:    Patient ID: Kimberly [name], female    DOB: 05-18-1951, 70 y.o.   MRN: [id number]   HPI: Kimberly [name] is a 70 y.o. female presenting for Dx with CoVID on 3/17. Fluid in ears. Head congestion. Some cough with occasional productivity. Feels really tired. Denies dyspnea & fever. No rhinorrhea.   Depression screen [facility] 2/9 05/25/2020 02/21/2020 02/20/2020 06/06/2019 02/03/2019  Decreased Interest 0 0 0 0 0  Down, Depressed, Hopeless 0 0 0 0 0  PHQ - 2 Score 0 0 0 0 0  Altered sleeping 0 - - 0 0  Tired, decreased energy 0 - - 0 0  Change in appetite 0 - - 0 0  Feeling bad or failure about yourself  0 - - 0 0  Trouble concentrating 0 - - 0 0  Moving slowly or fidgety/restless 0 - - 0 0  Suicidal thoughts 0 - - 0 0  PHQ-9 Score 0 - - 0 0     Relevant past medical, surgical, family and social history reviewed and updated as indicated.  Interim medical history since our last visit reviewed. Allergies and medications reviewed and updated.  ROS:  Review of Systems  Constitutional: Positive for fatigue. Negative for chills, diaphoresis and fever.  HENT: Positive for congestion. Negative for ear pain, hearing loss, postnasal drip, rhinorrhea, sore throat and trouble swallowing.   Respiratory: Negative for cough, chest tightness and shortness of breath.   Cardiovascular: Negative for chest pain and palpitations.  Gastrointestinal: Negative for abdominal pain.  Musculoskeletal: Negative for arthralgias.  Skin: Negative for rash.     Social History   Tobacco Use  Smoking Status Never Smoker  Smokeless Tobacco Never Used       Objective:     Wt Readings from Last 3 Encounters:  05/25/20 187 lb (84.8 kg)  02/20/20 189 lb 12.8 oz (86.1 kg)  06/06/19 187 lb (84.8 kg)     Exam deferred. Pt. Harboring due to COVID 19. Phone visit performed.   Assessment & Plan:   1. COVID-19 virus infection     Diagnoses and all orders for this visit:  COVID-19 virus  infection  She has standard Covid symptoms that are overall mild.  She was reassured.  She should rest and keep yourself hydrated and notify the office if she gets short of breath or high fever as well as losing sense of taste and smell.  Should she develop a complication such as pneumonia she noticed that her cough becomes more profuse and productive.  Virtual Visit via telephone Note  I discussed the limitations, risks, security and privacy concerns of performing an evaluation and management service by telephone and the availability of in person appointments. The patient was identified with two identifiers. Pt.expressed understanding and agreed to proceed. Pt. Is at home. Dr. Livia Snellen is in his office.  Follow Up Instructions:   I discussed the assessment and treatment plan with the patient. The patient was provided an opportunity to ask questions and all were answered. The patient agreed with the plan and demonstrated an understanding of the instructions.   The patient was advised to call back or seek an in-person evaluation if the symptoms worsen or if the condition fails to improve as anticipated.   Total minutes including chart review and phone contact time: 14   Follow up plan: Return if symptoms worsen or fail to improve.  Claretta Fraise, MD [place]

## 2020-11-13 ENCOUNTER — Other Ambulatory Visit: Payer: Self-pay

## 2020-11-13 ENCOUNTER — Ambulatory Visit (INDEPENDENT_AMBULATORY_CARE_PROVIDER_SITE_OTHER): Payer: Medicare Other | Admitting: Nurse Practitioner

## 2020-11-13 VITALS — BP 118/67 | HR 77 | Temp 98.0°F | Ht 64.0 in | Wt 182.0 lb

## 2020-11-13 DIAGNOSIS — R7989 Other specified abnormal findings of blood chemistry: Secondary | ICD-10-CM

## 2020-11-13 DIAGNOSIS — M79671 Pain in right foot: Secondary | ICD-10-CM | POA: Diagnosis not present
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
## 2020-11-13 NOTE — Patient Instructions (Signed)

## 2020-11-14 ENCOUNTER — Other Ambulatory Visit: Payer: Self-pay | Admitting: Family Medicine

## 2020-11-14 ENCOUNTER — Telehealth: Payer: Self-pay | Admitting: Family Medicine

## 2020-11-14 LAB — THYROID PANEL WITH TSH
Free Thyroxine Index: 2.1 (ref 1.2–4.9)
T3 Uptake Ratio: 29 % (ref 24–39)
T4, Total: 7.1 ug/dL (ref 4.5–12.0)
TSH: 2.21 u[IU]/mL (ref 0.450–4.500)

## 2020-11-14 MED ORDER — LEVOTHYROXINE SODIUM 25 MCG PO TABS: 25.0000 ug | ORAL_TABLET | Freq: Every day | ORAL | 3 refills | Status: DC

## 2020-11-15 ENCOUNTER — Encounter: Payer: Self-pay | Admitting: Nurse Practitioner

## 2020-11-15 NOTE — Assessment & Plan Note (Signed)
Symptoms of right foot pain and swelling for a month not well controlled.  Patient reports having a history of plantar fasciitis and wondering it may be exacerbated.  Patient is reporting mild tingling and intermittent numbness.  Advised patient to use compression socks and elevate feet.  Completed referral to orthopedic for reassessment of plantar fasciitis.  Advised the use of warm or cold compress as tolerated ibuprofen or Tylenol for pain follow-up with worsening unresolved symptoms. Patient verbalized understanding.

## 2020-11-20 ENCOUNTER — Encounter: Payer: Self-pay | Admitting: Family Medicine

## 2020-11-20 ENCOUNTER — Ambulatory Visit: Payer: Self-pay

## 2020-11-20 ENCOUNTER — Ambulatory Visit (INDEPENDENT_AMBULATORY_CARE_PROVIDER_SITE_OTHER): Payer: Medicare Other

## 2020-11-20 ENCOUNTER — Ambulatory Visit (INDEPENDENT_AMBULATORY_CARE_PROVIDER_SITE_OTHER): Payer: Medicare Other | Admitting: Family Medicine

## 2020-11-20 ENCOUNTER — Other Ambulatory Visit: Payer: Self-pay

## 2020-11-20 VITALS — BP 138/82 | HR 64 | Ht 64.0 in | Wt 186.0 lb

## 2020-11-20 DIAGNOSIS — M79671 Pain in right foot: Secondary | ICD-10-CM | POA: Diagnosis not present

## 2020-11-20 DIAGNOSIS — M25562 Pain in left knee: Secondary | ICD-10-CM

## 2020-11-20 DIAGNOSIS — M7731 Calcaneal spur, right foot: Secondary | ICD-10-CM | POA: Diagnosis not present

## 2020-11-20 DIAGNOSIS — M1712 Unilateral primary osteoarthritis, left knee: Secondary | ICD-10-CM | POA: Diagnosis not present

## 2020-11-20 DIAGNOSIS — G8929 Other chronic pain: Secondary | ICD-10-CM

## 2020-11-20 DIAGNOSIS — I8393 Asymptomatic varicose veins of bilateral lower extremities: Secondary | ICD-10-CM

## 2020-11-20 DIAGNOSIS — M25762 Osteophyte, left knee: Secondary | ICD-10-CM | POA: Diagnosis not present

## 2020-11-20 NOTE — Assessment & Plan Note (Signed)
Seems to be secondary to medial heel spur as well as some mild plantar fasciitis noted on ultrasound today.  We discussed over-the-counter orthotics, home exercises and work with Product/process development scientist.  We discussed which activities to do which wants to avoid.  We discussed avoiding being barefoot.  Worsening pain consider formal physical therapy or potential injections.  Follow-up again in 6 weeks

## 2020-11-20 NOTE — Patient Instructions (Signed)
OA with trupull lite- Ryan will call you Xray today Spenco Total Support Orthotics Ice Avoid being barefoot in the house See me again in 6 weeks

## 2020-11-20 NOTE — Assessment & Plan Note (Signed)
Getting ABIs to further evaluate but likely normal discussed compression.

## 2020-11-20 NOTE — Assessment & Plan Note (Signed)
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  X-rays ordered again today to further evaluate for any progression.  Due to the abnormal thigh to calf ratio and the instability that patient is having I do believe that patient would could do better with an OA stability brace with a Tru pull lite feature.  We will see if patient's insurance will potentially help [place] with this.  Patient of course wants to avoid any type of surgery or any replacement if possible.  We discussed icing regimen, possible injections in the long run, but patient is not having significant pain.  Follow-up with me again 6 weeks
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
## 2020-11-21 ENCOUNTER — Ambulatory Visit: Admitting: Family Medicine

## 2020-11-21 ENCOUNTER — Other Ambulatory Visit: Payer: Self-pay | Admitting: Family Medicine

## 2020-11-21 DIAGNOSIS — M79671 Pain in right foot: Secondary | ICD-10-CM

## 2020-11-21 DIAGNOSIS — G8929 Other chronic pain: Secondary | ICD-10-CM

## 2020-11-21 DIAGNOSIS — M25562 Pain in left knee: Secondary | ICD-10-CM

## 2020-11-22 ENCOUNTER — Encounter: Payer: Self-pay | Admitting: Family Medicine

## 2020-11-23 ENCOUNTER — Ambulatory Visit (INDEPENDENT_AMBULATORY_CARE_PROVIDER_SITE_OTHER): Payer: Medicare Other | Admitting: Nurse Practitioner

## 2020-11-23 ENCOUNTER — Other Ambulatory Visit: Payer: Self-pay

## 2020-11-23 ENCOUNTER — Encounter: Payer: Self-pay | Admitting: Nurse Practitioner

## 2020-11-23 VITALS — BP 124/77 | HR 84 | Temp 97.9°F | Ht 64.0 in | Wt 184.0 lb

## 2020-11-23 DIAGNOSIS — R159 Full incontinence of feces: Secondary | ICD-10-CM | POA: Insufficient documentation

## 2020-11-23 MED ORDER — SILVER SULFADIAZINE 1 % EX CREA: 1.0000 "application " | TOPICAL_CREAM | Freq: Every day | CUTANEOUS | 0 refills | Status: DC

## 2020-11-23 MED ORDER — LOPERAMIDE HCL 2 MG PO TABS: 2.0000 mg | ORAL_TABLET | Freq: Four times a day (QID) | ORAL | 0 refills | Status: DC | PRN

## 2020-11-23 NOTE — Assessment & Plan Note (Signed)
Patient is a 70 year old female who presents to clinic for fecal incontinence.  Patient is not reporting any pain or fever but does have slight discomfort.  Patient reports no diarrhea but more leakage.  Assessment of patient's rectum completed.  Rectal digital exam completed no hemorrhoids or internal tumors found.  Wink of the anal sphincter to rule out spincter weakness observed using a Q-tip.  Patient has no impaction of stool. From assessment patient may have a stomach virus.  Advised patient to increase fluids, reduce strain during bowel movements, increase fiber/bulk diet. Started patient on Imodium, given cup for stool culture, and advised patient to follow-up with worsening unresolved symptoms.   I educated patient that if fecal incontinence continues more assessment like an endoscopy or colonoscopy may be ordered.  Patient has no history of IBS.   Rx sent to pharmacy.

## 2020-11-23 NOTE — Patient Instructions (Signed)
Fecal Incontinence Fecal incontinence, also called accidental bowel leakage, is not being able to control your bowels. This condition happens because the nerves or muscles around the anus do not work the way they should. This affects their ability to hold stool (feces). What are the causes? This condition may be caused by:  Damage to the muscles at the end of the rectum (sphincter).  Damage to the nerves that control bowel movements.  Diarrhea.  Chronic constipation.  Pelvic floor dysfunction. This means the muscles in the pelvis do not work well.  Loss of bowel storage capacity. This occurs when the rectum can no longer stretch in size in order to store feces.  Inflammatory bowel disease (IBD), such as Crohn's disease.  Irritable bowel syndrome (IBS). What increases the risk? You are more likely to develop this condition if you:  Were born with bowels or a pelvis that did not form correctly.  Have had rectal surgery.  Have had radiation treatment for certain cancers.  Have been pregnant, had a vaginal delivery, or had surgery that damaged the pelvic floor muscles.  Had a complicated childbirth, spinal cord injury, or other trauma that caused nerve damage.  Have a condition that can affect nerve function, such as diabetes, Parkinson's disease, or multiple sclerosis.  Have a condition where the rectum drops down into the anus or vagina (prolapse).  Are 59 years of age or older. What are the signs or symptoms? The main symptom of this condition is not being able to control your bowels. You also might not be able to get to the bathroom before a bowel movement. How is this diagnosed? This condition is diagnosed with a medical history and physical exam. You may also have other tests, including:  Blood tests.  Urine tests.  A rectal exam.  Ultrasound.  MRI.  Colonoscopy. This is an exam that looks at your large intestine (colon).  Anal manometry. This is a test that  measures the strength of the anal sphincter.  Anal electromyogram (EMG). This is a test that uses small electrodes to check for nerve damage. How is this treated? Treatment for this condition depends on the cause and severity. Treatment may also focus on addressing any underlying causes of this condition. Treatment may include:  Medicines. This may include medicines to: ? Prevent diarrhea. ? Help with constipation (bulk-forming laxatives). ? Treat any underlying conditions.  Biofeedback therapy. This can help to retrain muscles that are affected.  Fiber supplements. These can help manage your bowel movements.  Nerve stimulation.  Injectable gel to promote tissue growth and better muscle control.  Surgery. You may need: ? Sphincter repair surgery. ? Diversion surgery. This procedure lets feces pass out of your body through a hole in your abdomen. Follow these instructions at home: Eating and drinking  Follow instructions from your health care provider about any eating or drinking restrictions. ? Work with a dietitian to come up with a healthy diet that will help you avoid the foods that can make your condition worse. ? Keep a diet diary to find out which foods or drinks could be making your condition worse.  Drink enough fluid to keep your urine pale yellow.   Lifestyle  Do not use any products that contain nicotine or tobacco, such as cigarettes and e-cigarettes. If you need help quitting, ask your health care provider. This may help your condition.  If you are overweight, talk with your health care provider about how to safely lose weight. This may  help your condition.  Increase your physical activity as told by your health care provider. This may help your condition. Always talk with your health care provider before starting a new exercise program.  Carry a change of clothes and supplies to clean up quickly if you have an episode of fetal incontinence.  Consider joining a  fecal incontinence support group. You can find a support group online or in your local community. General instructions  Take over-the-counter and prescription medicines only as told by your health care provider. This includes any supplements.  Apply a moisture barrier, such as petroleum jelly, to your rectum. This protects the skin from irritation caused by ongoing leaking or diarrhea.  Tell your health care provider if you are upset or depressed about your condition.  Keep all follow-up visits as told by your health care provider. This is important.   Where to find more information  International Foundation for Functional Gastrointestinal Disorders: iffgd.[place] [facility]: patients.gi.org Contact a health care provider if:  You have a fever.  You have redness, swelling, or pain around your rectum.  Your pain is getting worse or you lose feeling in your rectal area.  You have blood in your stool.  You feel sad or hopeless.  You avoid social or work situations. Get help right away if:  You stop having bowel movements.  You cannot eat or drink without vomiting.  You have rectal bleeding that does not stop.  You have severe pain that is getting worse.  You have symptoms of dehydration, including: ? Sleepiness or fatigue. ? Producing little or no urine, tears, or sweat. ? Dizziness. ? Dry mouth. ? Unusual irritability. ? Headache. ? Inability to think clearly. Summary  Fecal incontinence, also called accidental bowel leakage, is not being able to control your bowels. This condition happens because the nerves or muscles around the anus do not work the way they should.  Treatment varies depending on the cause and severity of your condition. Treatment may also focus on addressing any underlying causes of this condition.  Follow instructions from your health care provider about any eating or drinking restrictions, lifestyle changes, and skin  care.  Take over-the-counter and prescription medicines only as told by your health care provider. This includes any supplements.  Tell your health care provider if your symptoms worsen or if you are upset or depressed about your condition. This information is not intended to replace advice given to you by your health care provider. Make sure you discuss any questions you have with your health care provider. Document Revised: 11/19/2017 Document Reviewed: 11/19/2017 Elsevier Patient Education  2021 [employer].
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
## 2020-11-26 ENCOUNTER — Other Ambulatory Visit: Payer: Self-pay

## 2020-11-26 ENCOUNTER — Other Ambulatory Visit: Payer: Medicare Other

## 2020-11-27 ENCOUNTER — Ambulatory Visit (HOSPITAL_COMMUNITY)
Admission: RE | Admit: 2020-11-27 | Discharge: 2020-11-27 | Disposition: A | Discharge status: Home / Self Care | Payer: Medicare Other | Source: Ambulatory Visit | Attending: Internal Medicine | Admitting: Internal Medicine

## 2020-11-27 ENCOUNTER — Other Ambulatory Visit: Payer: Self-pay

## 2020-11-27 DIAGNOSIS — G8929 Other chronic pain: Secondary | ICD-10-CM | POA: Diagnosis not present

## 2020-11-27 DIAGNOSIS — M25562 Pain in left knee: Secondary | ICD-10-CM | POA: Diagnosis not present

## 2020-11-27 DIAGNOSIS — R6889 Other general symptoms and signs: Secondary | ICD-10-CM

## 2020-11-27 DIAGNOSIS — M79671 Pain in right foot: Secondary | ICD-10-CM | POA: Diagnosis not present

## 2020-11-28 DIAGNOSIS — M1712 Unilateral primary osteoarthritis, left knee: Secondary | ICD-10-CM | POA: Diagnosis not present

## 2020-11-30 LAB — STOOL CULTURE: E coli, Shiga toxin Assay: NEGATIVE

## 2020-12-10 ENCOUNTER — Other Ambulatory Visit: Payer: Self-pay

## 2020-12-10 ENCOUNTER — Ambulatory Visit (INDEPENDENT_AMBULATORY_CARE_PROVIDER_SITE_OTHER): Payer: Medicare Other | Admitting: Surgery

## 2020-12-10 ENCOUNTER — Encounter: Payer: Self-pay | Admitting: Surgery

## 2020-12-10 VITALS — BP 137/77 | HR 68 | Temp 97.9°F | Resp 20 | Ht 64.0 in | Wt 181.0 lb

## 2020-12-10 DIAGNOSIS — I872 Venous insufficiency (chronic) (peripheral): Secondary | ICD-10-CM | POA: Diagnosis not present
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
## 2020-12-25 ENCOUNTER — Ambulatory Visit: Payer: Medicare HMO | Admitting: Family Medicine
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
## 2021-01-01 ENCOUNTER — Ambulatory Visit (INDEPENDENT_AMBULATORY_CARE_PROVIDER_SITE_OTHER): Payer: Medicare Other | Admitting: Family Medicine

## 2021-01-01 ENCOUNTER — Encounter: Payer: Self-pay | Admitting: Family Medicine

## 2021-01-01 ENCOUNTER — Ambulatory Visit: Payer: Self-pay

## 2021-01-01 ENCOUNTER — Other Ambulatory Visit: Payer: Self-pay

## 2021-01-01 VITALS — BP 122/78 | HR 72 | Ht 64.0 in | Wt 178.0 lb

## 2021-01-01 DIAGNOSIS — G8929 Other chronic pain: Secondary | ICD-10-CM | POA: Diagnosis not present

## 2021-01-01 DIAGNOSIS — M7062 Trochanteric bursitis, left hip: Secondary | ICD-10-CM | POA: Insufficient documentation

## 2021-01-01 DIAGNOSIS — M79671 Pain in right foot: Secondary | ICD-10-CM

## 2021-01-01 DIAGNOSIS — M1712 Unilateral primary osteoarthritis, left knee: Secondary | ICD-10-CM | POA: Diagnosis not present

## 2021-01-01 NOTE — Assessment & Plan Note (Signed)
Patient is doing relatively well overall.  Patient's x-rays do show some mild changes.  Doing better though with the custom brace.  Continue to stay active.  Follow-up with me again in 2 to 3 months.  Worsening pain consider possible injection

## 2021-01-01 NOTE — Assessment & Plan Note (Signed)
Patient does have a bone spur but continue to be over-the-counter orthotics.  Avoid being barefoot.  Worsening pain can consider injections but hopefully patient will do well.  Possibility of following surgical intervention but I do hope patient will be able to avoid this.

## 2021-01-01 NOTE — Assessment & Plan Note (Signed)
LungsPatient given injection and tolerated the procedure well.  Discussed icing regimen and home exercises.  Discussed which activities to avoid.  Follow-up again in.  Believe patient will do very well.

## 2021-01-01 NOTE — Patient Instructions (Signed)
Injected hip today Exercises 3x a week

## 2021-02-18 ENCOUNTER — Encounter: Payer: Self-pay | Admitting: Family Medicine
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
## 2021-02-22 ENCOUNTER — Ambulatory Visit: Payer: Self-pay

## 2021-02-22 ENCOUNTER — Ambulatory Visit (INDEPENDENT_AMBULATORY_CARE_PROVIDER_SITE_OTHER): Payer: Medicare Other | Admitting: Family Medicine

## 2021-02-22 ENCOUNTER — Encounter: Payer: Self-pay | Admitting: Family Medicine

## 2021-02-22 ENCOUNTER — Encounter

## 2021-02-22 ENCOUNTER — Other Ambulatory Visit: Payer: Self-pay

## 2021-02-22 VITALS — BP 124/90 | HR 58 | Ht 64.0 in | Wt 176.0 lb

## 2021-02-22 DIAGNOSIS — M79671 Pain in right foot: Secondary | ICD-10-CM | POA: Diagnosis not present

## 2021-02-22 DIAGNOSIS — G8929 Other chronic pain: Secondary | ICD-10-CM | POA: Diagnosis not present

## 2021-02-22 NOTE — Patient Instructions (Addendum)
Injected R heel today Take it easy over weekend Start exercises on Monday Wear supportive shoes as much as possible See me in 6-8 weeks

## 2021-02-22 NOTE — Assessment & Plan Note (Signed)
Chronic pain that has failed conservative therapy.  Does have a bone spur noted.  We discussed the use of the orthotic still, home exercises, icing regimen.  Patient will see how she responds.  If worsening pain will need to consider the possibility of surgical intervention but hopefully patient will respond well.

## 2021-02-27 ENCOUNTER — Ambulatory Visit: Payer: Medicare Other | Admitting: Family Medicine

## 2021-03-04 ENCOUNTER — Ambulatory Visit (INDEPENDENT_AMBULATORY_CARE_PROVIDER_SITE_OTHER): Payer: Medicare Other

## 2021-03-04 VITALS — Ht 64.0 in | Wt 176.0 lb

## 2021-03-04 DIAGNOSIS — Z780 Asymptomatic menopausal state: Secondary | ICD-10-CM

## 2021-03-04 DIAGNOSIS — Z1211 Encounter for screening for malignant neoplasm of colon: Secondary | ICD-10-CM

## 2021-03-04 DIAGNOSIS — Z0000 Encounter for general adult medical examination without abnormal findings: Secondary | ICD-10-CM | POA: Diagnosis not present
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
## 2021-03-04 NOTE — Patient Instructions (Signed)
Kimberly [name] , Thank you for taking time to come for your Medicare Wellness Visit. I appreciate your ongoing commitment to your health goals. Please review the following plan we discussed and let me know if I can assist you in the future.   Screening recommendations/referrals: Colonoscopy: Cologuard done 10/19/2017; Repeat every 3 years - ordered repeat today Mammogram: Done 12/13/2013 - Recommend repeat annually - patient declined Bone Density: Done 12/21/2013 - Recommend repeat every 2 years - ordered today Recommended yearly ophthalmology/optometry visit for glaucoma screening and checkup Recommended yearly dental visit for hygiene and checkup  Vaccinations: Influenza vaccine: Done 05/25/2020 - Repeat annually Pneumococcal vaccine: Due (2 vaccines one year apart) Tdap vaccine: Due (every 10 years Shingles vaccine: Due. (2 doses 2-6 months apart)   Covid-19: Done 09/14/19 & 10/12/19 - due for booster  Advanced directives: Please bring a copy of your health care power of attorney and living will to the office to be added to your chart at your convenience.   Conditions/risks identified: Aim for 30 minutes of exercise or brisk walking each day, drink 6-8 glasses of water and eat lots of fruits and vegetables.   Next appointment: Follow up in one year for your annual wellness visit    Preventive Care 65 Years and Older, Female Preventive care refers to lifestyle choices and visits with your health care provider that can promote health and wellness. What does preventive care include? A yearly physical exam. This is also called an annual well check. Dental exams once or twice a year. Routine eye exams. Ask your health care provider how often you should have your eyes checked. Personal lifestyle choices, including: Daily care of your teeth and gums. Regular physical activity. Eating a healthy diet. Avoiding tobacco and drug use. Limiting alcohol use. Practicing safe sex. Taking low-dose aspirin  every day. Taking vitamin and mineral supplements as recommended by your health care provider. What happens during an annual well check? The services and screenings done by your health care provider during your annual well check will depend on your age, overall health, lifestyle risk factors, and family history of disease. Counseling  Your health care provider may ask you questions about your: Alcohol use. Tobacco use. Drug use. Emotional well-being. Home and relationship well-being. Sexual activity. Eating habits. History of falls. Memory and ability to understand (cognition). Work and work Statistician. Reproductive health. Screening  You may have the following tests or measurements: Height, weight, and BMI. Blood pressure. Lipid and cholesterol levels. These may be checked every 5 years, or more frequently if you are over 12 years old. Skin check. Lung cancer screening. You may have this screening every year starting at age 110 if you have a 30-pack-year history of smoking and currently smoke or have quit within the past 15 years. Fecal occult blood test (FOBT) of the stool. You may have this test every year starting at age 51. Flexible sigmoidoscopy or colonoscopy. You may have a sigmoidoscopy every 5 years or a colonoscopy every 10 years starting at age 63. Hepatitis C blood test. Hepatitis B blood test. Sexually transmitted disease (STD) testing. Diabetes screening. This is done by checking your blood sugar (glucose) after you have not eaten for a while (fasting). You may have this done every 1-3 years. Bone density scan. This is done to screen for osteoporosis. You may have this done starting at age 45. Mammogram. This may be done every 1-2 years. Talk to your health care provider about how often you should have  regular mammograms. Talk with your health care provider about your test results, treatment options, and if necessary, the need for more tests. Vaccines  Your health  care provider may recommend certain vaccines, such as: Influenza vaccine. This is recommended every year. Tetanus, diphtheria, and acellular pertussis (Tdap, Td) vaccine. You may need a Td booster every 10 years. Zoster vaccine. You may need this after age 21. Pneumococcal 13-valent conjugate (PCV13) vaccine. One dose is recommended after age 76. Pneumococcal polysaccharide (PPSV23) vaccine. One dose is recommended after age 77. Talk to your health care provider about which screenings and vaccines you need and how often you need them. This information is not intended to replace advice given to you by your health care provider. Make sure you discuss any questions you have with your health care provider. Document Released: 08/03/2015 Document Revised: 03/26/2016 Document Reviewed: 05/08/2015 Elsevier Interactive Patient Education  2017 [employer] Prevention in the Home Falls can cause injuries. They can happen to people of all ages. There are many things you can do to make your home safe and to help prevent falls. What can I do on the outside of my home? Regularly fix the edges of walkways and driveways and fix any cracks. Remove anything that might make you trip as you walk through a door, such as a raised step or threshold. Trim any bushes or trees on the path to your home. Use bright outdoor lighting. Clear any walking paths of anything that might make someone trip, such as rocks or tools. Regularly check to see if handrails are loose or broken. Make sure that both sides of any steps have handrails. Any raised decks and porches should have guardrails on the edges. Have any leaves, snow, or ice cleared regularly. Use sand or salt on walking paths during winter. Clean up any spills in your garage right away. This includes oil or grease spills. What can I do in the bathroom? Use night lights. Install grab bars by the toilet and in the tub and shower. Do not use towel bars as grab  bars. Use non-skid mats or decals in the tub or shower. If you need to sit down in the shower, use a plastic, non-slip stool. Keep the floor dry. Clean up any water that spills on the floor as soon as it happens. Remove soap buildup in the tub or shower regularly. Attach bath mats securely with double-sided non-slip rug tape. Do not have throw rugs and other things on the floor that can make you trip. What can I do in the bedroom? Use night lights. Make sure that you have a light by your bed that is easy to reach. Do not use any sheets or blankets that are too big for your bed. They should not hang down onto the floor. Have a firm chair that has side arms. You can use this for support while you get dressed. Do not have throw rugs and other things on the floor that can make you trip. What can I do in the kitchen? Clean up any spills right away. Avoid walking on wet floors. Keep items that you use a lot in easy-to-reach places. If you need to reach something above you, use a strong step stool that has a grab bar. Keep electrical cords out of the way. Do not use floor polish or wax that makes floors slippery. If you must use wax, use non-skid floor wax. Do not have throw rugs and other things on the floor that  can make you trip. What can I do with my stairs? Do not leave any items on the stairs. Make sure that there are handrails on both sides of the stairs and use them. Fix handrails that are broken or loose. Make sure that handrails are as long as the stairways. Check any carpeting to make sure that it is firmly attached to the stairs. Fix any carpet that is loose or worn. Avoid having throw rugs at the top or bottom of the stairs. If you do have throw rugs, attach them to the floor with carpet tape. Make sure that you have a light switch at the top of the stairs and the bottom of the stairs. If you do not have them, ask someone to add them for you. What else can I do to help prevent  falls? Wear shoes that: Do not have high heels. Have rubber bottoms. Are comfortable and fit you well. Are closed at the toe. Do not wear sandals. If you use a stepladder: Make sure that it is fully opened. Do not climb a closed stepladder. Make sure that both sides of the stepladder are locked into place. Ask someone to hold it for you, if possible. Clearly mark and make sure that you can see: Any grab bars or handrails. First and last steps. Where the edge of each step is. Use tools that help you move around (mobility aids) if they are needed. These include: Canes. Walkers. Scooters. Crutches. Turn on the lights when you go into a dark area. Replace any light bulbs as soon as they burn out. Set up your furniture so you have a clear path. Avoid moving your furniture around. If any of your floors are uneven, fix them. If there are any pets around you, be aware of where they are. Review your medicines with your doctor. Some medicines can make you feel dizzy. This can increase your chance of falling. Ask your doctor what other things that you can do to help prevent falls. This information is not intended to replace advice given to you by your health care provider. Make sure you discuss any questions you have with your health care provider. Document Released: 05/03/2009 Document Revised: 12/13/2015 Document Reviewed: 08/11/2014 Elsevier Interactive Patient Education  2017 [employer].

## 2021-03-12 DIAGNOSIS — Z1211 Encounter for screening for malignant neoplasm of colon: Secondary | ICD-10-CM | POA: Diagnosis not present

## 2021-03-13 ENCOUNTER — Other Ambulatory Visit: Payer: Self-pay

## 2021-03-13 ENCOUNTER — Ambulatory Visit: Payer: Medicare Other

## 2021-03-13 ENCOUNTER — Encounter: Payer: Self-pay | Admitting: Family Medicine

## 2021-03-13 ENCOUNTER — Ambulatory Visit (INDEPENDENT_AMBULATORY_CARE_PROVIDER_SITE_OTHER): Payer: Medicare Other | Admitting: Family Medicine

## 2021-03-13 VITALS — BP 121/75 | HR 77 | Temp 97.7°F | Ht 64.0 in | Wt 172.6 lb

## 2021-03-13 DIAGNOSIS — E7800 Pure hypercholesterolemia, unspecified: Secondary | ICD-10-CM | POA: Diagnosis not present

## 2021-03-13 DIAGNOSIS — S83512S Sprain of anterior cruciate ligament of left knee, sequela: Secondary | ICD-10-CM | POA: Diagnosis not present

## 2021-03-13 DIAGNOSIS — E039 Hypothyroidism, unspecified: Secondary | ICD-10-CM | POA: Diagnosis not present

## 2021-03-13 DIAGNOSIS — M722 Plantar fascial fibromatosis: Secondary | ICD-10-CM

## 2021-03-13 DIAGNOSIS — E559 Vitamin D deficiency, unspecified: Secondary | ICD-10-CM | POA: Diagnosis not present

## 2021-03-13 MED ORDER — LEVOTHYROXINE SODIUM 25 MCG PO TABS: 25.0000 ug | ORAL_TABLET | Freq: Every day | ORAL | 3 refills | Status: DC

## 2021-03-13 NOTE — Patient Instructions (Signed)
Come in for FASTING labs in 6 weeks.  We will do your thyroid tests then as well  Go back on the Levothyroxine Hypothyroidism  Hypothyroidism is when the thyroid gland does not make enough of certain hormones (it is underactive). The thyroid gland is a small gland located in the lower front part of the neck, just in front of the windpipe (trachea). This gland makes hormones that help control how the body uses food for energy (metabolism) as well as how the heart and brain function. These hormones also play a role in keeping your bones strong. When the thyroid is underactive, it produces too little of the hormones thyroxine (T4) and triiodothyronine (T3). What are the causes? This condition may be caused by: Hashimoto's disease. This is a disease in which the body's disease-fighting system (immune system) attacks the thyroid gland. This is the most common cause. Viral infections. Pregnancy. Certain medicines. Birth defects. Past radiation treatments to the head or neck for cancer. Past treatment with radioactive iodine. Past exposure to radiation in the environment. Past surgical removal of part or all of the thyroid. Problems with a gland in the center of the brain (pituitary gland). Lack of enough iodine in the diet. What increases the risk? You are more likely to develop this condition if: You are female. You have a family history of thyroid conditions. You use a medicine called lithium. You take medicines that affect the immune system (immunosuppressants). What are the signs or symptoms? Symptoms of this condition include: Feeling as though you have no energy (lethargy). Not being able to tolerate cold. Weight gain that is not explained by a change in diet or exercise habits. Lack of appetite. Dry skin. Coarse hair. Menstrual irregularity. Slowing of thought processes. Constipation. Sadness or depression. How is this diagnosed? This condition may be diagnosed based on: Your  symptoms, your medical history, and a physical exam. Blood tests. You may also have imaging tests, such as an ultrasound or MRI. How is this treated? This condition is treated with medicine that replaces the thyroid hormones that your body does not make. After you begin treatment, it may take several weeksfor symptoms to go away. Follow these instructions at home: Take over-the-counter and prescription medicines only as told by your health care provider. If you start taking any new medicines, tell your health care provider. Keep all follow-up visits as told by your health care provider. This is important. As your condition improves, your dosage of thyroid hormone medicine may change. You will need to have blood tests regularly so that your health care provider can monitor your condition. Contact a health care provider if: Your symptoms do not get better with treatment. You are taking thyroid hormone replacement medicine and you: Sweat a lot. Have tremors. Feel anxious. Lose weight rapidly. Cannot tolerate heat. Have emotional swings. Have diarrhea. Feel weak. Get help right away if you have: Chest pain. An irregular heartbeat. A rapid heartbeat. Difficulty breathing. Summary Hypothyroidism is when the thyroid gland does not make enough of certain hormones (it is underactive). When the thyroid is underactive, it produces too little of the hormones thyroxine (T4) and triiodothyronine (T3). The most common cause is Hashimoto's disease, a disease in which the body's disease-fighting system (immune system) attacks the thyroid gland. The condition can also be caused by viral infections, medicine, pregnancy, or past radiation treatment to the head or neck. Symptoms may include weight gain, dry skin, constipation, feeling as though you do not have energy, and not  being able to tolerate cold. This condition is treated with medicine to replace the thyroid hormones that your body does not  make. This information is not intended to replace advice given to you by your health care provider. Make sure you discuss any questions you have with your healthcare provider. Document Revised: 04/06/2020 Document Reviewed: 03/22/2020 Elsevier Patient Education  2022 [employer].
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
## 2021-03-18 ENCOUNTER — Ambulatory Visit: Payer: Medicare Other

## 2021-03-18 ENCOUNTER — Encounter (HOSPITAL_COMMUNITY): Payer: Medicare Other

## 2021-03-19 LAB — COLOGUARD: Cologuard: NEGATIVE

## 2021-03-26 ENCOUNTER — Ambulatory Visit: Payer: Medicare Other | Admitting: Family Medicine
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
## 2021-04-09 ENCOUNTER — Ambulatory Visit: Payer: Self-pay

## 2021-04-09 ENCOUNTER — Other Ambulatory Visit: Payer: Self-pay

## 2021-04-09 ENCOUNTER — Ambulatory Visit (INDEPENDENT_AMBULATORY_CARE_PROVIDER_SITE_OTHER): Payer: Medicare Other | Admitting: Family Medicine

## 2021-04-09 VITALS — BP 108/78 | HR 52 | Ht 64.0 in | Wt 173.0 lb

## 2021-04-09 DIAGNOSIS — M722 Plantar fascial fibromatosis: Secondary | ICD-10-CM | POA: Diagnosis not present

## 2021-04-09 DIAGNOSIS — M79671 Pain in right foot: Secondary | ICD-10-CM | POA: Diagnosis not present

## 2021-04-09 DIAGNOSIS — G8929 Other chronic pain: Secondary | ICD-10-CM | POA: Diagnosis not present

## 2021-04-09 NOTE — Assessment & Plan Note (Signed)
Significant improvement after the injection at this time.  Encourage patient to continue to stay active.  Discussed icing regimen and home exercises as well as the over-the-counter orthotics.  Follow-up with me again in 3 months

## 2021-04-09 NOTE — Patient Instructions (Signed)
Good to see you  So glad you are doing better  Continue with orthotics Avoid being barefoot Follow up in 3 months just in case or see me when you need me

## 2021-04-22 ENCOUNTER — Ambulatory Visit: Payer: Medicare Other

## 2021-04-22 ENCOUNTER — Encounter (HOSPITAL_COMMUNITY): Payer: Medicare Other

## 2021-05-18 ENCOUNTER — Telehealth: Payer: Medicare Other | Admitting: Nurse Practitioner

## 2021-05-18 DIAGNOSIS — A084 Viral intestinal infection, unspecified: Secondary | ICD-10-CM

## 2021-05-18 MED ORDER — ONDANSETRON HCL 4 MG PO TABS: 4.0000 mg | ORAL_TABLET | Freq: Three times a day (TID) | ORAL | 0 refills | Status: DC | PRN

## 2021-05-18 NOTE — Progress Notes (Signed)
Virtual Visit Consent   Kimberly [name], you are scheduled for a virtual visit with Mary-Margaret Hassell Done, [place], a [facility] provider, today.     Just as with appointments in the office, your consent must be obtained to participate.  Your consent will be active for this visit and any virtual visit you may have with one of our providers in the next 365 days.     If you have a MyChart account, a copy of this consent can be sent to you electronically.  All virtual visits are billed to your insurance company just like a traditional visit in the office.    As this is a virtual visit, video technology does not allow for your provider to perform a traditional examination.  This may limit your provider's ability to fully assess your condition.  If your provider identifies any concerns that need to be evaluated in person or the need to arrange testing (such as labs, EKG, etc.), we will make arrangements to do so.     Although advances in technology are sophisticated, we cannot ensure that it will always work on either your end or our end.  If the connection with a video visit is poor, the visit may have to be switched to a telephone visit.  With either a video or telephone visit, we are not always able to ensure that we have a secure connection.     I need to obtain your verbal consent now.   Are you willing to proceed with your visit today? YES   Kimberly [name] has provided verbal consent on 05/18/2021 for a virtual visit (video or telephone).   Mary-Margaret Hassell Done, FNP   Date: 05/18/2021 1:36 PM   Virtual Visit via Video Note   I, Mary-Margaret Hassell Done, connected with Kimberly [name] ([id number], Aug 13, 1950) on 05/18/21 at  2:00 PM EDT by a video-enabled telemedicine application and verified that I am speaking with the correct person using two identifiers.  Location: Patient: Virtual Visit Location Patient: Home Provider: Virtual Visit Location Provider: Mobile   I discussed the limitations of  evaluation and management by telemedicine and the availability of in person appointments. The patient expressed understanding and agreed to proceed.    History of Present Illness: Kimberly [name] is a 70 y.o. who identifies as a female who was assigned female at birth, and is being seen today for vomiting and diarrhea.  HPI: Patient states that about 130this morning she woke up with diarrhea. Several hours later she started vomiting. She has no been able to eat or drink anything without throwing up. She has chills and is shking all over. Vomiting has stopped but still has diarrhea. She has taken imodium AD  x3 today.  Has low grade fever  of 100.    Review of Systems  Constitutional:  Positive for fever and malaise/fatigue.  HENT: Negative.    Respiratory: Negative.    Gastrointestinal:  Positive for diarrhea, nausea and vomiting. Negative for abdominal pain.  Genitourinary: Negative.   Neurological:  Positive for headaches.  All other systems reviewed and are negative.  Problems:  Patient Active Problem List   Diagnosis Date Noted   Acquired hypothyroidism 03/13/2021   Plantar fasciitis, right 03/13/2021   Greater trochanteric bursitis, left 01/01/2021   Incontinence of feces 11/23/2020   Pain of right heel 11/13/2020   Varicose veins of both lower extremities without ulcer or inflammation 02/20/2020   Left ACL tear 01/25/2019   Degenerative arthritis of left  knee 01/25/2019   Neoplasm of uncertain behavior of skin 03/20/2017   Lower back pain 03/07/2015   Osteopenia 12/21/2013   Recurrent depression ([place]) 01/05/2013    Allergies: No Known Allergies Medications:  Current Outpatient Medications:    citalopram (CELEXA) 10 MG tablet, Take 1 tablet (10 mg total) by mouth daily., Disp: 90 tablet, Rfl: 3   levothyroxine (SYNTHROID) 25 MCG tablet, Take 1 tablet (25 mcg total) by mouth daily before breakfast., Disp: 90 tablet, Rfl: 3   loperamide (IMODIUM A-D) 2 MG tablet, Take 1 tablet  (2 mg total) by mouth 4 (four) times daily as needed for diarrhea or loose stools., Disp: 30 tablet, Rfl: 0   silver sulfADIAZINE (SILVADENE) 1 % cream, Apply 1 application topically daily., Disp: 50 g, Rfl: 0  Observations/Objective: Patient is well-developed, well-nourished in no acute distress.  Resting comfortably  at home.  Head is normocephalic, atraumatic.  No labored breathing.  Speech is clear and coherent with logical content.  Patient is alert and oriented at baseline.  Face is pale  Assessment and Plan:  Kimberly [name] in today with chief complaint of No chief complaint on file.   1. Viral gastroenteritis First 24 Hours-Clear liquids  popsicles  Jello  gatorade  Sprite Second 24 hours-Add Full liquids ( Liquids you cant see through) Third 24 hours- Bland diet ( foods that are baked or broiled)  *avoiding fried foods and highly spiced foods* During these 3 days  Avoid milk, cheese, ice cream or any other dairy products  Avoid caffeine- REMEMBER Mt. Dew and Mello Yellow contain lots of caffeine You should eat and drink in  Frequent small volumes If no improvement in symptoms or worsen in 2-3 days should RETRUN TO OFFICE or go to ER!   Continue imodium AD OTC - ondansetron (ZOFRAN) 4 MG tablet; Take 1 tablet (4 mg total) by mouth every 8 (eight) hours as needed for nausea or vomiting.  Dispense: 20 tablet; Refill: 0     Follow Up Instructions: I discussed the assessment and treatment plan with the patient. The patient was provided an opportunity to ask questions and all were answered. The patient agreed with the plan and demonstrated an understanding of the instructions.  A copy of instructions were sent to the patient via MyChart.  The patient was advised to call back or seek an in-person evaluation if the symptoms worsen or if the condition fails to improve as anticipated.  Time:  I spent 11 minutes with the patient via telehealth technology discussing the above  problems/concerns.    Mary-Margaret Hassell Done, FNP

## 2021-05-20 ENCOUNTER — Ambulatory Visit (HOSPITAL_COMMUNITY)
Admission: RE | Admit: 2021-05-20 | Discharge: 2021-05-20 | Disposition: A | Discharge status: Home / Self Care | Payer: Medicare Other | Source: Ambulatory Visit | Attending: Surgery | Admitting: Surgery

## 2021-05-20 ENCOUNTER — Ambulatory Visit (INDEPENDENT_AMBULATORY_CARE_PROVIDER_SITE_OTHER): Payer: Medicare Other | Admitting: Physician Assistant

## 2021-05-20 ENCOUNTER — Other Ambulatory Visit: Payer: Self-pay

## 2021-05-20 VITALS — BP 103/67 | HR 74 | Temp 98.1°F | Resp 20 | Ht 64.0 in | Wt 171.5 lb

## 2021-05-20 DIAGNOSIS — I82431 Acute embolism and thrombosis of right popliteal vein: Secondary | ICD-10-CM

## 2021-05-20 DIAGNOSIS — I872 Venous insufficiency (chronic) (peripheral): Secondary | ICD-10-CM
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
## 2021-05-27 DIAGNOSIS — S9031XA Contusion of right foot, initial encounter: Secondary | ICD-10-CM | POA: Diagnosis not present
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
## 2021-06-22 ENCOUNTER — Encounter: Payer: Self-pay | Admitting: Family Medicine

## 2021-06-22 ENCOUNTER — Encounter: Payer: Self-pay | Admitting: Surgery

## 2021-06-24 ENCOUNTER — Telehealth: Payer: Self-pay

## 2021-06-24 ENCOUNTER — Other Ambulatory Visit: Payer: Self-pay

## 2021-06-24 ENCOUNTER — Encounter: Payer: Self-pay | Admitting: Nurse Practitioner

## 2021-06-24 ENCOUNTER — Inpatient Hospital Stay: Payer: Medicare Other

## 2021-06-24 ENCOUNTER — Inpatient Hospital Stay: Payer: Medicare Other | Attending: Nurse Practitioner | Admitting: Nurse Practitioner

## 2021-06-24 VITALS — BP 134/79 | HR 85 | Temp 98.7°F | Resp 18 | Ht 64.0 in | Wt 173.2 lb

## 2021-06-24 DIAGNOSIS — E039 Hypothyroidism, unspecified: Secondary | ICD-10-CM | POA: Diagnosis not present

## 2021-06-24 DIAGNOSIS — Z79899 Other long term (current) drug therapy: Secondary | ICD-10-CM | POA: Insufficient documentation

## 2021-06-24 DIAGNOSIS — M8580 Other specified disorders of bone density and structure, unspecified site: Secondary | ICD-10-CM | POA: Diagnosis not present

## 2021-06-24 DIAGNOSIS — K219 Gastro-esophageal reflux disease without esophagitis: Secondary | ICD-10-CM | POA: Diagnosis not present

## 2021-06-24 DIAGNOSIS — I82491 Acute embolism and thrombosis of other specified deep vein of right lower extremity: Secondary | ICD-10-CM

## 2021-06-24 DIAGNOSIS — Z86718 Personal history of other venous thrombosis and embolism: Secondary | ICD-10-CM | POA: Diagnosis not present

## 2021-06-24 DIAGNOSIS — N6312 Unspecified lump in the right breast, upper inner quadrant: Secondary | ICD-10-CM | POA: Insufficient documentation

## 2021-06-24 DIAGNOSIS — Z7901 Long term (current) use of anticoagulants: Secondary | ICD-10-CM | POA: Diagnosis not present

## 2021-06-24 LAB — ANTITHROMBIN III: AntiThromb III Func: 113 % (ref 75–120)

## 2021-06-24 NOTE — Telephone Encounter (Signed)
Pt scheduled for doppler 06/25/21

## 2021-06-25 ENCOUNTER — Ambulatory Visit (INDEPENDENT_AMBULATORY_CARE_PROVIDER_SITE_OTHER): Payer: Medicare Other

## 2021-06-25 ENCOUNTER — Encounter: Payer: Self-pay | Admitting: Nurse Practitioner

## 2021-06-25 DIAGNOSIS — I82491 Acute embolism and thrombosis of other specified deep vein of right lower extremity: Secondary | ICD-10-CM | POA: Diagnosis not present

## 2021-06-26 ENCOUNTER — Other Ambulatory Visit: Payer: Self-pay | Admitting: Nurse Practitioner

## 2021-06-26 DIAGNOSIS — I82491 Acute embolism and thrombosis of other specified deep vein of right lower extremity: Secondary | ICD-10-CM

## 2021-06-27 LAB — HOMOCYSTEINE: Homocysteine: 16.8 umol/L (ref 0.0–17.2)

## 2021-06-28 ENCOUNTER — Ambulatory Visit (INDEPENDENT_AMBULATORY_CARE_PROVIDER_SITE_OTHER): Payer: Medicare Other | Admitting: Nurse Practitioner

## 2021-06-28 ENCOUNTER — Encounter: Payer: Self-pay | Admitting: Nurse Practitioner

## 2021-06-28 DIAGNOSIS — J0100 Acute maxillary sinusitis, unspecified: Secondary | ICD-10-CM

## 2021-06-28 LAB — PROTEIN C, TOTAL: Protein C, Total: 127 % (ref 60–150)

## 2021-06-28 LAB — CARDIOLIPIN ANTIBODIES, IGG, IGM, IGA
Anticardiolipin IgA: <9 APL U/mL (ref 0–11)
Anticardiolipin IgG: <9 GPL U/mL (ref 0–14)
Anticardiolipin IgM: 13 MPL U/mL — ABNORMAL HIGH (ref 0–12)

## 2021-06-28 LAB — BETA-2-GLYCOPROTEIN I ABS, IGG/M/A
Beta-2 Glyco I IgG: <9 GPI IgG units (ref 0–20)
Beta-2-Glycoprotein I IgA: <9 GPI IgA units (ref 0–25)
Beta-2-Glycoprotein I IgM: <9 GPI IgM units (ref 0–32)

## 2021-06-28 LAB — PROTEIN S, TOTAL: Protein S Ag, Total: 115 % (ref 60–150)

## 2021-06-28 LAB — LUPUS ANTICOAGULANT PANEL
DRVVT: 39.6 s (ref 0.0–47.0)
PTT Lupus Anticoagulant: 29.4 s (ref 0.0–51.9)

## 2021-06-28 LAB — PROTEIN C ACTIVITY: Protein C Activity: 167 % (ref 73–180)

## 2021-06-28 LAB — PROTEIN S ACTIVITY: Protein S Activity: 73 % (ref 63–140)

## 2021-06-28 MED ORDER — AMOXICILLIN-POT CLAVULANATE 875-125 MG PO TABS: 1.0000 | ORAL_TABLET | Freq: Two times a day (BID) | ORAL | 0 refills | Status: DC

## 2021-06-28 NOTE — Patient Instructions (Signed)

## 2021-06-28 NOTE — Progress Notes (Signed)
Virtual Visit  Note Due to COVID-19 pandemic this visit was conducted virtually. This visit type was conducted due to national recommendations for restrictions regarding the COVID-19 Pandemic (e.g. social distancing, sheltering in place) in an effort to limit this patient's exposure and mitigate transmission in our community. All issues noted in this document were discussed and addressed.  A physical exam was not performed with this format.  I connected with Kimberly [name] on 06/28/21 at 8:58 by telephone and verified that I am speaking with the correct person using two identifiers. Kimberly [name] is currently located at home and no one is currently with her during visit. The provider, Mary-Margaret Hassell Done, FNP is located in their office at time of visit.  I discussed the limitations, risks, security and privacy concerns of performing an evaluation and management service by telephone and the availability of in person appointments. I also discussed with the patient that there may be a patient responsible charge related to this service. The patient expressed understanding and agreed to proceed.   History and Present Illness:  Patient states she has been sick for 2 weeks. No fever. But has cough and congestion. She has been taking mucinex. Her head has become really stopped up with lots of facial pressure. She is still doing mucinex and sudafed.     Review of Systems  Constitutional:  Positive for malaise/fatigue. Negative for chills and fever.  HENT:  Positive for congestion and sinus pain. Negative for ear pain and sore throat.   Respiratory:  Positive for cough and sputum production. Negative for shortness of breath.   Musculoskeletal:  Negative for myalgias.  Neurological:  Positive for headaches. Negative for dizziness.    Observations/Objective: Alert and oriented- answers all questions appropriately No distress Raspy vice Dry cough noted during visit.  Assessment and  Plan: [name] in today with chief complaint of sinusitis  1. Acute non-recurrent maxillary sinusitis 1. Take meds as prescribed 2. Use a cool mist humidifier especially during the winter months and when heat has been humid. 3. Use saline nose sprays frequently 4. Saline irrigations of the nose can be very helpful if done frequently.  * 4X daily for 1 week*  * Use of a nettie pot can be helpful with this. Follow directions with this* 5. Drink plenty of fluids 6. Keep thermostat turn down low 7.For any cough or congestion- mucinex 8. For fever or aces or pains- take tylenol or ibuprofen appropriate for age and weight.  * for fevers greater than 101 orally you may alternate ibuprofen and tylenol every  3 hours.   Meds ordered this encounter  Medications   amoxicillin-clavulanate (AUGMENTIN) 875-125 MG tablet    Sig: Take 1 tablet by mouth 2 (two) times daily.    Dispense:  14 tablet    Refill:  0    Order Specific Question:   Supervising Provider    Answer:   Caryl Pina A [[id number]]       Follow Up Instructions: prn    I discussed the assessment and treatment plan with the patient. The patient was provided an opportunity to ask questions and all were answered. The patient agreed with the plan and demonstrated an understanding of the instructions.   The patient was advised to call back or seek an in-person evaluation if the symptoms worsen or if the condition fails to improve as anticipated.  The above assessment and management plan was discussed with the patient. The patient verbalized understanding of  and has agreed to the management plan. Patient is aware to call the clinic if symptoms persist or worsen. Patient is aware when to return to the clinic for a follow-up visit. Patient educated on when it is appropriate to go to the emergency department.   Time call ended:  9:10  I provided 12 minutes of  non face-to-face time during this encounter.    Mary-Margaret  Hassell Done, FNP

## 2021-07-02 ENCOUNTER — Ambulatory Visit: Payer: Medicare Other | Admitting: Family Medicine

## 2021-07-02 LAB — FACTOR 5 LEIDEN

## 2021-07-02 LAB — PROTHROMBIN GENE MUTATION

## 2021-07-12 ENCOUNTER — Other Ambulatory Visit: Payer: Self-pay

## 2021-07-12 ENCOUNTER — Inpatient Hospital Stay (HOSPITAL_BASED_OUTPATIENT_CLINIC_OR_DEPARTMENT_OTHER): Payer: Medicare Other | Admitting: Nurse Practitioner

## 2021-07-12 ENCOUNTER — Other Ambulatory Visit: Payer: Self-pay | Admitting: Nurse Practitioner

## 2021-07-12 ENCOUNTER — Encounter: Payer: Self-pay | Admitting: Nurse Practitioner

## 2021-07-12 ENCOUNTER — Inpatient Hospital Stay: Payer: Medicare Other

## 2021-07-12 VITALS — BP 144/55 | HR 68 | Temp 98.1°F | Resp 18 | Ht 64.0 in | Wt 176.0 lb

## 2021-07-12 DIAGNOSIS — K219 Gastro-esophageal reflux disease without esophagitis: Secondary | ICD-10-CM | POA: Diagnosis not present

## 2021-07-12 DIAGNOSIS — N6312 Unspecified lump in the right breast, upper inner quadrant: Secondary | ICD-10-CM | POA: Diagnosis not present

## 2021-07-12 DIAGNOSIS — I82491 Acute embolism and thrombosis of other specified deep vein of right lower extremity: Secondary | ICD-10-CM

## 2021-07-12 DIAGNOSIS — Z7901 Long term (current) use of anticoagulants: Secondary | ICD-10-CM | POA: Diagnosis not present

## 2021-07-12 DIAGNOSIS — E039 Hypothyroidism, unspecified: Secondary | ICD-10-CM | POA: Diagnosis not present

## 2021-07-12 DIAGNOSIS — M8580 Other specified disorders of bone density and structure, unspecified site: Secondary | ICD-10-CM | POA: Diagnosis not present

## 2021-07-12 DIAGNOSIS — Z79899 Other long term (current) drug therapy: Secondary | ICD-10-CM | POA: Diagnosis not present

## 2021-07-12 DIAGNOSIS — Z86718 Personal history of other venous thrombosis and embolism: Secondary | ICD-10-CM | POA: Diagnosis not present

## 2021-07-12 LAB — CBC WITH DIFFERENTIAL (CANCER CENTER ONLY)
Abs Immature Granulocytes: 0.01 10*3/uL (ref 0.00–0.07)
Basophils Absolute: 0 10*3/uL (ref 0.0–0.1)
Basophils Relative: 1 %
Eosinophils Absolute: 0.1 10*3/uL (ref 0.0–0.5)
Eosinophils Relative: 2 %
HCT: 37.1 % (ref 36.0–46.0)
Hemoglobin: 11.6 g/dL — ABNORMAL LOW (ref 12.0–15.0)
Immature Granulocytes: 0 %
Lymphocytes Relative: 20 %
Lymphs Abs: 1 10*3/uL (ref 0.7–4.0)
MCH: 29.1 pg (ref 26.0–34.0)
MCHC: 31.3 g/dL (ref 30.0–36.0)
MCV: 93.2 fL (ref 80.0–100.0)
Monocytes Absolute: 0.5 10*3/uL (ref 0.1–1.0)
Monocytes Relative: 10 %
Neutro Abs: 3.3 10*3/uL (ref 1.7–7.7)
Neutrophils Relative %: 67 %
Platelet Count: 185 10*3/uL (ref 150–400)
RBC: 3.98 MIL/uL (ref 3.87–5.11)
RDW: 13.7 % (ref 11.5–15.5)
WBC Count: 4.8 10*3/uL (ref 4.0–10.5)
nRBC: 0 % (ref 0.0–0.2)

## 2021-07-12 LAB — CMP (CANCER CENTER ONLY)
ALT: 9 U/L (ref 0–44)
AST: 13 U/L — ABNORMAL LOW (ref 15–41)
Albumin: 3.9 g/dL (ref 3.5–5.0)
Alkaline Phosphatase: 96 U/L (ref 38–126)
Anion gap: 6 (ref 5–15)
BUN: 17 mg/dL (ref 8–23)
CO2: 28 mmol/L (ref 22–32)
Calcium: 8.9 mg/dL (ref 8.9–10.3)
Chloride: 106 mmol/L (ref 98–111)
Creatinine: 0.9 mg/dL (ref 0.44–1.00)
GFR, Estimated: >60 mL/min (ref >60)
Glucose, Bld: 85 mg/dL (ref 70–99)
Potassium: 4.3 mmol/L (ref 3.5–5.1)
Sodium: 140 mmol/L (ref 135–145)
Total Bilirubin: 0.6 mg/dL (ref 0.3–1.2)
Total Protein: 6.6 g/dL (ref 6.5–8.1)

## 2021-07-12 MED ORDER — APIXABAN 5 MG PO TABS: 5.0000 mg | ORAL_TABLET | Freq: Two times a day (BID) | ORAL | 2 refills | Status: DC
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
## 2021-07-16 ENCOUNTER — Telehealth: Payer: Self-pay

## 2021-07-16 NOTE — Telephone Encounter (Signed)
-----   Message from Owens Shark, NP sent at 07/12/2021  3:07 PM EST ----- Please let her know her labs look good.  She has mild anemia.  We will check a ferritin level at her next visit.

## 2021-07-16 NOTE — Telephone Encounter (Signed)
Called and spoke to patient  Patient voiced understanding

## 2021-07-18 DIAGNOSIS — L600 Ingrowing nail: Secondary | ICD-10-CM | POA: Diagnosis not present

## 2021-07-18 DIAGNOSIS — B351 Tinea unguium: Secondary | ICD-10-CM | POA: Diagnosis not present

## 2021-07-25 ENCOUNTER — Ambulatory Visit: Payer: Medicare Other | Admitting: Nurse Practitioner

## 2021-07-31 ENCOUNTER — Other Ambulatory Visit: Payer: Self-pay | Admitting: Nurse Practitioner

## 2021-07-31 DIAGNOSIS — N63 Unspecified lump in unspecified breast: Secondary | ICD-10-CM

## 2021-08-07 ENCOUNTER — Ambulatory Visit
Admission: RE | Admit: 2021-08-07 | Discharge: 2021-08-07 | Disposition: A | Discharge status: Home / Self Care | Payer: No Typology Code available for payment source | Source: Ambulatory Visit | Attending: Nurse Practitioner | Admitting: Nurse Practitioner

## 2021-08-07 ENCOUNTER — Other Ambulatory Visit: Payer: Self-pay

## 2021-08-07 ENCOUNTER — Ambulatory Visit
Admission: RE | Admit: 2021-08-07 | Discharge: 2021-08-07 | Disposition: A | Discharge status: Home / Self Care | Payer: Medicare Other | Source: Ambulatory Visit | Attending: Nurse Practitioner | Admitting: Nurse Practitioner

## 2021-08-07 DIAGNOSIS — N63 Unspecified lump in unspecified breast: Secondary | ICD-10-CM

## 2021-08-07 DIAGNOSIS — R922 Inconclusive mammogram: Secondary | ICD-10-CM | POA: Diagnosis not present

## 2021-08-07 DIAGNOSIS — I82491 Acute embolism and thrombosis of other specified deep vein of right lower extremity: Secondary | ICD-10-CM

## 2021-08-07 DIAGNOSIS — N6011 Diffuse cystic mastopathy of right breast: Secondary | ICD-10-CM | POA: Diagnosis not present

## 2021-09-13 ENCOUNTER — Encounter: Payer: Self-pay | Admitting: Family Medicine

## 2021-09-13 ENCOUNTER — Ambulatory Visit (INDEPENDENT_AMBULATORY_CARE_PROVIDER_SITE_OTHER): Payer: No Typology Code available for payment source | Admitting: Family Medicine

## 2021-09-13 VITALS — BP 123/72 | HR 79 | Temp 97.2°F | Ht 64.0 in | Wt 177.8 lb

## 2021-09-13 DIAGNOSIS — E039 Hypothyroidism, unspecified: Secondary | ICD-10-CM

## 2021-09-13 DIAGNOSIS — E559 Vitamin D deficiency, unspecified: Secondary | ICD-10-CM | POA: Insufficient documentation

## 2021-09-13 DIAGNOSIS — Z8781 Personal history of (healed) traumatic fracture: Secondary | ICD-10-CM

## 2021-09-13 DIAGNOSIS — Z0000 Encounter for general adult medical examination without abnormal findings: Secondary | ICD-10-CM

## 2021-09-13 DIAGNOSIS — G8929 Other chronic pain: Secondary | ICD-10-CM | POA: Diagnosis not present

## 2021-09-13 DIAGNOSIS — Z0001 Encounter for general adult medical examination with abnormal findings: Secondary | ICD-10-CM | POA: Diagnosis not present

## 2021-09-13 DIAGNOSIS — M25562 Pain in left knee: Secondary | ICD-10-CM | POA: Diagnosis not present

## 2021-09-13 DIAGNOSIS — E7800 Pure hypercholesterolemia, unspecified: Secondary | ICD-10-CM | POA: Diagnosis not present

## 2021-09-13 DIAGNOSIS — M549 Dorsalgia, unspecified: Secondary | ICD-10-CM

## 2021-09-13 HISTORY — DX: Personal history of (healed) traumatic fracture: Z87.81
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
## 2021-09-13 NOTE — Patient Instructions (Addendum)
Get fasting labs with your next hematology lab draw.   Preventive Care 34 Years and Older, Female Preventive care refers to lifestyle choices and visits with your health care provider that can promote health and wellness. Preventive care visits are also called wellness exams. What can I expect for my preventive care visit? Counseling Your health care provider may ask you questions about your: Medical history, including: Past medical problems. Family medical history. Pregnancy and menstrual history. History of falls. Current health, including: Memory and ability to understand (cognition). Emotional well-being. Home life and relationship well-being. Sexual activity and sexual health. Lifestyle, including: Alcohol, nicotine or tobacco, and drug use. Access to firearms. Diet, exercise, and sleep habits. Work and work Statistician. Sunscreen use. Safety issues such as seatbelt and bike helmet use. Physical exam Your health care provider will check your: Height and weight. These may be used to calculate your BMI (body mass index). BMI is a measurement that tells if you are at a healthy weight. Waist circumference. This measures the distance around your waistline. This measurement also tells if you are at a healthy weight and may help predict your risk of certain diseases, such as type 2 diabetes and high blood pressure. Heart rate and blood pressure. Body temperature. Skin for abnormal spots. What immunizations do I need? Vaccines are usually given at various ages, according to a schedule. Your health care provider will recommend vaccines for you based on your age, medical history, and lifestyle or other factors, such as travel or where you work. What tests do I need? Screening Your health care provider may recommend screening tests for certain conditions. This may include: Lipid and cholesterol levels. Hepatitis C test. Hepatitis B test. HIV (human immunodeficiency virus) test. STI  (sexually transmitted infection) testing, if you are at risk. Lung cancer screening. Colorectal cancer screening. Diabetes screening. This is done by checking your blood sugar (glucose) after you have not eaten for a while (fasting). Mammogram. Talk with your health care provider about how often you should have regular mammograms. BRCA-related cancer screening. This may be done if you have a family history of breast, ovarian, tubal, or peritoneal cancers. Bone density scan. This is done to screen for osteoporosis. Talk with your health care provider about your test results, treatment options, and if necessary, the need for more tests. Follow these instructions at home: Eating and drinking  Eat a diet that includes fresh fruits and vegetables, whole grains, lean protein, and low-fat dairy products. Limit your intake of foods with high amounts of sugar, saturated fats, and salt. Take vitamin and mineral supplements as recommended by your health care provider. Do not drink alcohol if your health care provider tells you not to drink. If you drink alcohol: Limit how much you have to 0-1 drink a day. Know how much alcohol is in your drink. In the [place] one drink equals one 12 oz bottle of beer (355 mL), one 5 oz glass of wine (148 mL), or one 1 oz glass of hard liquor (44 mL). Lifestyle Brush your teeth every morning and night with fluoride toothpaste. Floss one time each day. Exercise for at least 30 minutes 5 or more days each week. Do not use any products that contain nicotine or tobacco. These products include cigarettes, chewing tobacco, and vaping devices, such as e-cigarettes. If you need help quitting, ask your health care provider. Do not use drugs. If you are sexually active, practice safe sex. Use a condom or other form of protection in  order to prevent STIs. Take aspirin only as told by your health care provider. Make sure that you understand how much to take and what form to take. Work  with your health care provider to find out whether it is safe and beneficial for you to take aspirin daily. Ask your health care provider if you need to take a cholesterol-lowering medicine (statin). Find healthy ways to manage stress, such as: Meditation, yoga, or listening to music. Journaling. Talking to a trusted person. Spending time with friends and family. Minimize exposure to UV radiation to reduce your risk of skin cancer. Safety Always wear your seat belt while driving or riding in a vehicle. Do not drive: If you have been drinking alcohol. Do not ride with someone who has been drinking. When you are tired or distracted. While texting. If you have been using any mind-altering substances or drugs. Wear a helmet and other protective equipment during sports activities. If you have firearms in your house, make sure you follow all gun safety procedures. What's next? Visit your health care provider once a year for an annual wellness visit. Ask your health care provider how often you should have your eyes and teeth checked. Stay up to date on all vaccines. This information is not intended to replace advice given to you by your health care provider. Make sure you discuss any questions you have with your health care provider. Document Revised: 01/02/2021 Document Reviewed: 01/02/2021 Elsevier Patient Education  [place].

## 2021-10-10 ENCOUNTER — Inpatient Hospital Stay: Payer: No Typology Code available for payment source

## 2021-10-10 ENCOUNTER — Encounter: Payer: Self-pay | Admitting: Oncology

## 2021-10-10 ENCOUNTER — Other Ambulatory Visit: Payer: Self-pay

## 2021-10-10 ENCOUNTER — Inpatient Hospital Stay: Payer: No Typology Code available for payment source | Attending: Nurse Practitioner | Admitting: Oncology

## 2021-10-10 VITALS — BP 121/75 | HR 76 | Temp 97.8°F | Resp 19 | Ht 64.0 in | Wt 175.8 lb

## 2021-10-10 DIAGNOSIS — Z86718 Personal history of other venous thrombosis and embolism: Secondary | ICD-10-CM | POA: Diagnosis present

## 2021-10-10 DIAGNOSIS — M8580 Other specified disorders of bone density and structure, unspecified site: Secondary | ICD-10-CM | POA: Insufficient documentation

## 2021-10-10 DIAGNOSIS — E039 Hypothyroidism, unspecified: Secondary | ICD-10-CM | POA: Insufficient documentation

## 2021-10-10 DIAGNOSIS — Z7901 Long term (current) use of anticoagulants: Secondary | ICD-10-CM | POA: Insufficient documentation

## 2021-10-10 DIAGNOSIS — I82491 Acute embolism and thrombosis of other specified deep vein of right lower extremity: Secondary | ICD-10-CM

## 2021-10-10 DIAGNOSIS — N6312 Unspecified lump in the right breast, upper inner quadrant: Secondary | ICD-10-CM | POA: Diagnosis not present

## 2021-10-10 DIAGNOSIS — K219 Gastro-esophageal reflux disease without esophagitis: Secondary | ICD-10-CM | POA: Diagnosis not present

## 2021-10-10 LAB — CBC WITH DIFFERENTIAL (CANCER CENTER ONLY)
Abs Immature Granulocytes: 0.01 10*3/uL (ref 0.00–0.07)
Basophils Absolute: 0 10*3/uL (ref 0.0–0.1)
Basophils Relative: 1 %
Eosinophils Absolute: 0.1 10*3/uL (ref 0.0–0.5)
Eosinophils Relative: 1 %
HCT: 38.7 % (ref 36.0–46.0)
Hemoglobin: 12.6 g/dL (ref 12.0–15.0)
Immature Granulocytes: 0 %
Lymphocytes Relative: 30 %
Lymphs Abs: 1.3 10*3/uL (ref 0.7–4.0)
MCH: 29.9 pg (ref 26.0–34.0)
MCHC: 32.6 g/dL (ref 30.0–36.0)
MCV: 91.7 fL (ref 80.0–100.0)
Monocytes Absolute: 0.3 10*3/uL (ref 0.1–1.0)
Monocytes Relative: 7 %
Neutro Abs: 2.5 10*3/uL (ref 1.7–7.7)
Neutrophils Relative %: 61 %
Platelet Count: 216 10*3/uL (ref 150–400)
RBC: 4.22 MIL/uL (ref 3.87–5.11)
RDW: 13.3 % (ref 11.5–15.5)
WBC Count: 4.1 10*3/uL (ref 4.0–10.5)
nRBC: 0 % (ref 0.0–0.2)

## 2021-10-10 LAB — FERRITIN: Ferritin: 63 ng/mL (ref 11–307)

## 2021-10-10 NOTE — Progress Notes (Signed)
?  [facility] ?OFFICE PROGRESS NOTE ? ? ?Diagnosis: DVT ? ?INTERVAL HISTORY:  ? ?Ms. Raya returns as scheduled.  She continues apixaban.  No bleeding.  Few bruises over the hands.  She feels well.  Good appetite.  No difficulty with bowel or bladder function.  No new leg swelling or pain.  She has not noted any difference in the legs since starting apixaban.  She plans to follow-up with sports management for valuation of chronic left knee pain. ? ?Objective: ? ?Vital signs in last 24 hours: ? ?Blood pressure 121/75, pulse 76, temperature 97.8 ?F (36.6 ?C), temperature source Oral, resp. rate 19, height '5\' 4"'$  (1.626 m), weight 175 lb 12.8 oz (79.7 kg), SpO2 100 %. ?  ? ? ?Resp: Lungs clear bilaterally ?Cardio: Regular rate and rhythm ?GI: No hepatosplenomegaly ?Vascular: No leg edema, bilateral lower extremity venous varicosities ? ? ?Lab Results: ? ?Lab Results  ?Component Value Date  ? WBC 4.1 10/10/2021  ? HGB 12.6 10/10/2021  ? HCT 38.7 10/10/2021  ? MCV 91.7 10/10/2021  ? PLT 216 10/10/2021  ? NEUTROABS 2.5 10/10/2021  ? ? ?CMP  ?Lab Results  ?Component Value Date  ? NA 140 07/12/2021  ? K 4.3 07/12/2021  ? CL 106 07/12/2021  ? CO2 28 07/12/2021  ? GLUCOSE 85 07/12/2021  ? BUN 17 07/12/2021  ? CREATININE 0.90 07/12/2021  ? CALCIUM 8.9 07/12/2021  ? PROT 6.6 07/12/2021  ? ALBUMIN 3.9 07/12/2021  ? AST 13 (L) 07/12/2021  ? ALT 9 07/12/2021  ? ALKPHOS 96 07/12/2021  ? BILITOT 0.6 07/12/2021  ? GFRNONAA >60 07/12/2021  ? GFRAA 86 05/25/2020  ? ? ? ?Medications: I have reviewed the patient's current medications. ? ? ?Assessment/Plan: ?Right lower extremity age-indeterminate thrombus in the popliteal and gastrocnemius veins 05/20/2021 ?06/24/2021-hypercoagulation panel negative ?06/25/2021 age-indeterminate nonocclusive DVT involving the right popliteal vein.  No DVT seen in the gastrocnemius vein. ?Eliquis 5 mg twice daily for 3 months beginning 07/12/2021 ?Several family members with history of blood  clots ?Personal history of 2 miscarriages ?Area of nodularity right breast 1 o'clock position 2 cm from the [place] on exam 06/24/2021-mammogram/ultrasound 08/07/2021 confirmed no evidence of breast malignancy, benign fat necrosis changes in the upper inner right breast ?Hypothyroidism ?Osteopenia ?Reflux ? ? ? ?Disposition: ? KimberlyPham has completed 3 months of anticoagulation therapy for treatment of an age-indeterminate right popliteal DVT.  She noted the leg swellingprior to seeing Dr. Trula Slade in May 2023.  It is possible the DVT was associated with COVID-19 infection.  She had a negative hypercoagulation panel.  She has no apparent ongoing risk factor for DVT.  She will discontinue anticoagulation.  She will seek medical attention for symptoms of recurrent thrombosis. ? ?Ms. Cavagnaro will remain up-to-date on mammography and colon cancer screening.  She will continue clinical follow-up with Dr. Lajuana Ripple.  She is not scheduled for follow-up in the hematology clinic.  I am available to see her as needed. ? ?Betsy Coder, MD ? ?10/10/2021  ?10:53 AM ? ? ?
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
## 2021-10-11 ENCOUNTER — Encounter: Payer: Self-pay | Admitting: Family Medicine

## 2021-10-11 ENCOUNTER — Other Ambulatory Visit: Payer: Self-pay

## 2021-10-11 ENCOUNTER — Ambulatory Visit (INDEPENDENT_AMBULATORY_CARE_PROVIDER_SITE_OTHER): Payer: No Typology Code available for payment source | Admitting: Family Medicine

## 2021-10-11 DIAGNOSIS — M1712 Unilateral primary osteoarthritis, left knee: Secondary | ICD-10-CM

## 2021-10-11 NOTE — Assessment & Plan Note (Signed)
Patient does have mild to moderate changes but does have instability of the knee.  Due to the abnormal thigh to calf ratio patient was given an OA stability brace.  Patient has not worn it.  Encouraged her to try so.  Still not having any significant pain of the knee.  We can consider potential injections.  We will hold on any type of surgical intervention.  Patient is in agreement with the plan and can follow-up with me again in 2 to 3 months. ?

## 2021-10-11 NOTE — Patient Instructions (Addendum)
Try the brace with walking-Ryan: [phone]        ?Exercises 2-3 x a week ?No reason for replacement any time soon ?See me in 3 months ?

## 2021-10-16 ENCOUNTER — Other Ambulatory Visit: Payer: Self-pay | Admitting: Nurse Practitioner

## 2021-10-16 DIAGNOSIS — I82491 Acute embolism and thrombosis of other specified deep vein of right lower extremity: Secondary | ICD-10-CM

## 2021-12-11 DIAGNOSIS — H5203 Hypermetropia, bilateral: Secondary | ICD-10-CM | POA: Diagnosis not present

## 2021-12-11 DIAGNOSIS — Z0100 Encounter for examination of eyes and vision without abnormal findings: Secondary | ICD-10-CM | POA: Diagnosis not present

## 2021-12-24 ENCOUNTER — Other Ambulatory Visit: Payer: Self-pay | Admitting: Oncology

## 2021-12-24 ENCOUNTER — Inpatient Hospital Stay: Payer: No Typology Code available for payment source | Admitting: Oncology

## 2021-12-24 NOTE — Progress Notes (Signed)
err

## 2022-01-02 ENCOUNTER — Encounter: Payer: Self-pay | Admitting: Family Medicine
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
## 2022-01-13 DIAGNOSIS — Z961 Presence of intraocular lens: Secondary | ICD-10-CM | POA: Diagnosis not present

## 2022-01-13 DIAGNOSIS — H26491 Other secondary cataract, right eye: Secondary | ICD-10-CM | POA: Diagnosis not present

## 2022-01-13 DIAGNOSIS — H26492 Other secondary cataract, left eye: Secondary | ICD-10-CM | POA: Diagnosis not present

## 2022-01-13 DIAGNOSIS — H04123 Dry eye syndrome of bilateral lacrimal glands: Secondary | ICD-10-CM | POA: Diagnosis not present

## 2022-01-14 ENCOUNTER — Ambulatory Visit (INDEPENDENT_AMBULATORY_CARE_PROVIDER_SITE_OTHER): Payer: No Typology Code available for payment source | Admitting: Family Medicine

## 2022-01-14 ENCOUNTER — Ambulatory Visit: Payer: Self-pay

## 2022-01-14 ENCOUNTER — Encounter: Payer: Self-pay | Admitting: Family Medicine

## 2022-01-14 VITALS — BP 124/82 | HR 69 | Ht 64.0 in | Wt 174.0 lb

## 2022-01-14 DIAGNOSIS — G8929 Other chronic pain: Secondary | ICD-10-CM | POA: Diagnosis not present

## 2022-01-14 DIAGNOSIS — M79671 Pain in right foot: Secondary | ICD-10-CM

## 2022-01-14 DIAGNOSIS — M722 Plantar fascial fibromatosis: Secondary | ICD-10-CM

## 2022-01-14 NOTE — Patient Instructions (Signed)
Injected heel today Continue to wear good shoes and recovery sandals See me in 6-8 weeks

## 2022-01-14 NOTE — Assessment & Plan Note (Signed)
Patient quickly on ultrasound did have what appeared to be some hypoechoic changes that could be consistent with intermittent interstitial tearing noted.  Discussed icing regimen and home exercises, discussed which activities to do and which ones to avoid including the proper footwear.  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  Follow-up again 6 to 8 weeks.

## 2022-02-03 DIAGNOSIS — H5201 Hypermetropia, right eye: Secondary | ICD-10-CM | POA: Diagnosis not present

## 2022-02-03 DIAGNOSIS — H26492 Other secondary cataract, left eye: Secondary | ICD-10-CM | POA: Diagnosis not present

## 2022-02-03 DIAGNOSIS — H52223 Regular astigmatism, bilateral: Secondary | ICD-10-CM | POA: Diagnosis not present

## 2022-02-03 DIAGNOSIS — Z961 Presence of intraocular lens: Secondary | ICD-10-CM | POA: Diagnosis not present

## 2022-02-03 DIAGNOSIS — H26491 Other secondary cataract, right eye: Secondary | ICD-10-CM | POA: Diagnosis not present

## 2022-02-03 DIAGNOSIS — H04123 Dry eye syndrome of bilateral lacrimal glands: Secondary | ICD-10-CM | POA: Diagnosis not present

## 2022-02-03 DIAGNOSIS — H5212 Myopia, left eye: Secondary | ICD-10-CM | POA: Diagnosis not present
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
## 2022-02-25 ENCOUNTER — Ambulatory Visit: Payer: No Typology Code available for payment source | Admitting: Family Medicine

## 2022-03-05 ENCOUNTER — Ambulatory Visit (INDEPENDENT_AMBULATORY_CARE_PROVIDER_SITE_OTHER): Payer: No Typology Code available for payment source

## 2022-03-05 VITALS — Wt 174.0 lb

## 2022-03-05 DIAGNOSIS — Z0000 Encounter for general adult medical examination without abnormal findings: Secondary | ICD-10-CM
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
## 2022-03-05 NOTE — Patient Instructions (Signed)
Kimberly [name] , Thank you for taking time to come for your Medicare Wellness Visit. I appreciate your ongoing commitment to your health goals. Please review the following plan we discussed and let me know if I can assist you in the future.   Screening recommendations/referrals: Colonoscopy: Cologuard done 03/12/2021 - Repeat in 3 years  Mammogram: Done 08/07/2021 - Repeat annually  Bone Density: Done 12/21/2013 - recommended every 2 years - we can do this at your next visit Recommended yearly ophthalmology/optometry visit for glaucoma screening and checkup Recommended yearly dental visit for hygiene and checkup  Vaccinations:  Influenza vaccine: recommend every Fall Pneumococcal vaccine: recommend once per lifetime Prevnar-20 Tdap vaccine: recommend every 10 years Shingles vaccine: recommend Shingrix which is 2 doses 2-6 months apart and over 90% effective - Get at next visit* Covid-19: Done 09/14/2019 & 10/12/2019 - declines boosters  Advanced directives: Please bring a copy of your health care power of attorney and living will to the office to be added to your chart at your convenience.   Conditions/risks identified: Aim for 30 minutes of exercise or brisk walking, 6-8 glasses of water, and 5 servings of fruits and vegetables each day.   Next appointment: Follow up in one year for your annual wellness visit    Preventive Care 65 Years and Older, Female Preventive care refers to lifestyle choices and visits with your health care provider that can promote health and wellness. What does preventive care include? A yearly physical exam. This is also called an annual well check. Dental exams once or twice a year. Routine eye exams. Ask your health care provider how often you should have your eyes checked. Personal lifestyle choices, including: Daily care of your teeth and gums. Regular physical activity. Eating a healthy diet. Avoiding tobacco and drug use. Limiting alcohol use. Practicing safe  sex. Taking low-dose aspirin every day. Taking vitamin and mineral supplements as recommended by your health care provider. What happens during an annual well check? The services and screenings done by your health care provider during your annual well check will depend on your age, overall health, lifestyle risk factors, and family history of disease. Counseling  Your health care provider may ask you questions about your: Alcohol use. Tobacco use. Drug use. Emotional well-being. Home and relationship well-being. Sexual activity. Eating habits. History of falls. Memory and ability to understand (cognition). Work and work Statistician. Reproductive health. Screening  You may have the following tests or measurements: Height, weight, and BMI. Blood pressure. Lipid and cholesterol levels. These may be checked every 5 years, or more frequently if you are over 42 years old. Skin check. Lung cancer screening. You may have this screening every year starting at age 3 if you have a 30-pack-year history of smoking and currently smoke or have quit within the past 15 years. Fecal occult blood test (FOBT) of the stool. You may have this test every year starting at age 18. Flexible sigmoidoscopy or colonoscopy. You may have a sigmoidoscopy every 5 years or a colonoscopy every 10 years starting at age 16. Hepatitis C blood test. Hepatitis B blood test. Sexually transmitted disease (STD) testing. Diabetes screening. This is done by checking your blood sugar (glucose) after you have not eaten for a while (fasting). You may have this done every 1-3 years. Bone density scan. This is done to screen for osteoporosis. You may have this done starting at age 73. Mammogram. This may be done every 1-2 years. Talk to your health care provider  about how often you should have regular mammograms. Talk with your health care provider about your test results, treatment options, and if necessary, the need for more  tests. Vaccines  Your health care provider may recommend certain vaccines, such as: Influenza vaccine. This is recommended every year. Tetanus, diphtheria, and acellular pertussis (Tdap, Td) vaccine. You may need a Td booster every 10 years. Zoster vaccine. You may need this after age 64. Pneumococcal 13-valent conjugate (PCV13) vaccine. One dose is recommended after age 51. Pneumococcal polysaccharide (PPSV23) vaccine. One dose is recommended after age 8. Talk to your health care provider about which screenings and vaccines you need and how often you need them. This information is not intended to replace advice given to you by your health care provider. Make sure you discuss any questions you have with your health care provider. Document Released: 08/03/2015 Document Revised: 03/26/2016 Document Reviewed: 05/08/2015 Elsevier Interactive Patient Education  2017 [employer] Prevention in the Home Falls can cause injuries. They can happen to people of all ages. There are many things you can do to make your home safe and to help prevent falls. What can I do on the outside of my home? Regularly fix the edges of walkways and driveways and fix any cracks. Remove anything that might make you trip as you walk through a door, such as a raised step or threshold. Trim any bushes or trees on the path to your home. Use bright outdoor lighting. Clear any walking paths of anything that might make someone trip, such as rocks or tools. Regularly check to see if handrails are loose or broken. Make sure that both sides of any steps have handrails. Any raised decks and porches should have guardrails on the edges. Have any leaves, snow, or ice cleared regularly. Use sand or salt on walking paths during winter. Clean up any spills in your garage right away. This includes oil or grease spills. What can I do in the bathroom? Use night lights. Install grab bars by the toilet and in the tub and shower.  Do not use towel bars as grab bars. Use non-skid mats or decals in the tub or shower. If you need to sit down in the shower, use a plastic, non-slip stool. Keep the floor dry. Clean up any water that spills on the floor as soon as it happens. Remove soap buildup in the tub or shower regularly. Attach bath mats securely with double-sided non-slip rug tape. Do not have throw rugs and other things on the floor that can make you trip. What can I do in the bedroom? Use night lights. Make sure that you have a light by your bed that is easy to reach. Do not use any sheets or blankets that are too big for your bed. They should not hang down onto the floor. Have a firm chair that has side arms. You can use this for support while you get dressed. Do not have throw rugs and other things on the floor that can make you trip. What can I do in the kitchen? Clean up any spills right away. Avoid walking on wet floors. Keep items that you use a lot in easy-to-reach places. If you need to reach something above you, use a strong step stool that has a grab bar. Keep electrical cords out of the way. Do not use floor polish or wax that makes floors slippery. If you must use wax, use non-skid floor wax. Do not have throw rugs and  other things on the floor that can make you trip. What can I do with my stairs? Do not leave any items on the stairs. Make sure that there are handrails on both sides of the stairs and use them. Fix handrails that are broken or loose. Make sure that handrails are as long as the stairways. Check any carpeting to make sure that it is firmly attached to the stairs. Fix any carpet that is loose or worn. Avoid having throw rugs at the top or bottom of the stairs. If you do have throw rugs, attach them to the floor with carpet tape. Make sure that you have a light switch at the top of the stairs and the bottom of the stairs. If you do not have them, ask someone to add them for you. What else  can I do to help prevent falls? Wear shoes that: Do not have high heels. Have rubber bottoms. Are comfortable and fit you well. Are closed at the toe. Do not wear sandals. If you use a stepladder: Make sure that it is fully opened. Do not climb a closed stepladder. Make sure that both sides of the stepladder are locked into place. Ask someone to hold it for you, if possible. Clearly mark and make sure that you can see: Any grab bars or handrails. First and last steps. Where the edge of each step is. Use tools that help you move around (mobility aids) if they are needed. These include: Canes. Walkers. Scooters. Crutches. Turn on the lights when you go into a dark area. Replace any light bulbs as soon as they burn out. Set up your furniture so you have a clear path. Avoid moving your furniture around. If any of your floors are uneven, fix them. If there are any pets around you, be aware of where they are. Review your medicines with your doctor. Some medicines can make you feel dizzy. This can increase your chance of falling. Ask your doctor what other things that you can do to help prevent falls. This information is not intended to replace advice given to you by your health care provider. Make sure you discuss any questions you have with your health care provider. Document Released: 05/03/2009 Document Revised: 12/13/2015 Document Reviewed: 08/11/2014 Elsevier Interactive Patient Education  2017 [employer].

## 2022-03-27 ENCOUNTER — Other Ambulatory Visit: Payer: Self-pay | Admitting: Family Medicine

## 2022-03-27 DIAGNOSIS — E039 Hypothyroidism, unspecified: Secondary | ICD-10-CM

## 2022-03-31 ENCOUNTER — Ambulatory Visit (INDEPENDENT_AMBULATORY_CARE_PROVIDER_SITE_OTHER): Payer: No Typology Code available for payment source | Admitting: Family Medicine

## 2022-03-31 ENCOUNTER — Encounter: Payer: Self-pay | Admitting: Family Medicine

## 2022-03-31 VITALS — BP 115/75 | HR 98 | Temp 98.0°F | Ht 64.0 in | Wt 173.8 lb

## 2022-03-31 DIAGNOSIS — E7800 Pure hypercholesterolemia, unspecified: Secondary | ICD-10-CM

## 2022-03-31 DIAGNOSIS — E559 Vitamin D deficiency, unspecified: Secondary | ICD-10-CM

## 2022-03-31 DIAGNOSIS — Z780 Asymptomatic menopausal state: Secondary | ICD-10-CM

## 2022-03-31 DIAGNOSIS — M542 Cervicalgia: Secondary | ICD-10-CM | POA: Diagnosis not present

## 2022-03-31 DIAGNOSIS — E039 Hypothyroidism, unspecified: Secondary | ICD-10-CM | POA: Diagnosis not present

## 2022-03-31 DIAGNOSIS — W57XXXA Bitten or stung by nonvenomous insect and other nonvenomous arthropods, initial encounter: Secondary | ICD-10-CM | POA: Diagnosis not present

## 2022-03-31 DIAGNOSIS — S90862A Insect bite (nonvenomous), left foot, initial encounter: Secondary | ICD-10-CM | POA: Diagnosis not present
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
## 2022-04-01 ENCOUNTER — Other Ambulatory Visit: Payer: Self-pay | Admitting: Family Medicine

## 2022-04-01 DIAGNOSIS — E7800 Pure hypercholesterolemia, unspecified: Secondary | ICD-10-CM

## 2022-04-01 DIAGNOSIS — E039 Hypothyroidism, unspecified: Secondary | ICD-10-CM

## 2022-04-01 DIAGNOSIS — N179 Acute kidney failure, unspecified: Secondary | ICD-10-CM

## 2022-04-01 DIAGNOSIS — E559 Vitamin D deficiency, unspecified: Secondary | ICD-10-CM

## 2022-04-01 LAB — CMP14+EGFR
ALT: 9 IU/L (ref 0–32)
AST: 13 IU/L (ref 0–40)
Albumin/Globulin Ratio: 2.1 (ref 1.2–2.2)
Albumin: 4.5 g/dL (ref 3.8–4.8)
Alkaline Phosphatase: 131 IU/L — ABNORMAL HIGH (ref 44–121)
BUN/Creatinine Ratio: 23 (ref 12–28)
BUN: 24 mg/dL (ref 8–27)
Bilirubin Total: 0.5 mg/dL (ref 0.0–1.2)
CO2: 22 mmol/L (ref 20–29)
Calcium: 9.4 mg/dL (ref 8.7–10.3)
Chloride: 104 mmol/L (ref 96–106)
Creatinine, Ser: 1.06 mg/dL — ABNORMAL HIGH (ref 0.57–1.00)
Globulin, Total: 2.1 g/dL (ref 1.5–4.5)
Glucose: 59 mg/dL — ABNORMAL LOW (ref 70–99)
Potassium: 4.6 mmol/L (ref 3.5–5.2)
Sodium: 142 mmol/L (ref 134–144)
Total Protein: 6.6 g/dL (ref 6.0–8.5)
eGFR: 56 mL/min/{1.73_m2} — ABNORMAL LOW (ref >59)

## 2022-04-01 LAB — LIPID PANEL
Chol/HDL Ratio: 2.3 ratio (ref 0.0–4.4)
Cholesterol, Total: 235 mg/dL — ABNORMAL HIGH (ref 100–199)
HDL: 103 mg/dL (ref >39)
LDL Chol Calc (NIH): 114 mg/dL — ABNORMAL HIGH (ref 0–99)
Triglycerides: 109 mg/dL (ref 0–149)
VLDL Cholesterol Cal: 18 mg/dL (ref 5–40)

## 2022-04-01 LAB — TSH: TSH: 2.98 u[IU]/mL (ref 0.450–4.500)

## 2022-04-01 LAB — VITAMIN D 25 HYDROXY (VIT D DEFICIENCY, FRACTURES): Vit D, 25-Hydroxy: 16.5 ng/mL — ABNORMAL LOW (ref 30.0–100.0)

## 2022-04-01 LAB — T4, FREE: Free T4: 1.31 ng/dL (ref 0.82–1.77)

## 2022-04-01 MED ORDER — LEVOTHYROXINE SODIUM 25 MCG PO TABS: 25.0000 ug | ORAL_TABLET | Freq: Every day | ORAL | 3 refills | Status: DC

## 2022-04-01 MED ORDER — ROSUVASTATIN CALCIUM 10 MG PO TABS: 10.0000 mg | ORAL_TABLET | Freq: Every day | ORAL | 3 refills | Status: DC

## 2022-04-01 MED ORDER — VITAMIN D (ERGOCALCIFEROL) 1.25 MG (50000 UNIT) PO CAPS: 50000.0000 [IU] | ORAL_CAPSULE | ORAL | 3 refills | Status: DC

## 2022-04-07 ENCOUNTER — Encounter: Payer: Self-pay | Admitting: Physical Therapy

## 2022-04-07 ENCOUNTER — Other Ambulatory Visit: Payer: Self-pay

## 2022-04-07 ENCOUNTER — Ambulatory Visit (INDEPENDENT_AMBULATORY_CARE_PROVIDER_SITE_OTHER): Payer: No Typology Code available for payment source

## 2022-04-07 ENCOUNTER — Ambulatory Visit: Payer: No Typology Code available for payment source | Attending: Family Medicine | Admitting: Physical Therapy

## 2022-04-07 DIAGNOSIS — M542 Cervicalgia: Secondary | ICD-10-CM | POA: Diagnosis not present

## 2022-04-07 DIAGNOSIS — Z780 Asymptomatic menopausal state: Secondary | ICD-10-CM | POA: Diagnosis not present
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
## 2022-04-08 DIAGNOSIS — I700 Atherosclerosis of aorta: Secondary | ICD-10-CM | POA: Diagnosis not present

## 2022-04-08 DIAGNOSIS — Z008 Encounter for other general examination: Secondary | ICD-10-CM | POA: Diagnosis not present

## 2022-04-08 DIAGNOSIS — E669 Obesity, unspecified: Secondary | ICD-10-CM | POA: Diagnosis not present

## 2022-04-08 DIAGNOSIS — F325 Major depressive disorder, single episode, in full remission: Secondary | ICD-10-CM | POA: Diagnosis not present

## 2022-04-08 DIAGNOSIS — N1831 Chronic kidney disease, stage 3a: Secondary | ICD-10-CM | POA: Diagnosis not present

## 2022-04-08 DIAGNOSIS — E785 Hyperlipidemia, unspecified: Secondary | ICD-10-CM | POA: Diagnosis not present

## 2022-04-08 DIAGNOSIS — Z6830 Body mass index (BMI) 30.0-30.9, adult: Secondary | ICD-10-CM | POA: Diagnosis not present

## 2022-04-10 ENCOUNTER — Ambulatory Visit: Payer: No Typology Code available for payment source

## 2022-04-10 DIAGNOSIS — M542 Cervicalgia: Secondary | ICD-10-CM | POA: Diagnosis not present
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
## 2022-04-26 ENCOUNTER — Encounter: Payer: Self-pay | Admitting: Family Medicine

## 2022-04-28 ENCOUNTER — Other Ambulatory Visit: Payer: Self-pay | Admitting: Family Medicine

## 2022-04-28 DIAGNOSIS — F3342 Major depressive disorder, recurrent, in full remission: Secondary | ICD-10-CM

## 2022-04-28 MED ORDER — CITALOPRAM HYDROBROMIDE 10 MG PO TABS: 10.0000 mg | ORAL_TABLET | Freq: Every day | ORAL | 3 refills | Status: DC

## 2022-07-30 ENCOUNTER — Encounter: Payer: Self-pay | Admitting: Family Medicine

## 2022-07-30 ENCOUNTER — Ambulatory Visit: Payer: No Typology Code available for payment source | Admitting: Family Medicine

## 2022-07-30 ENCOUNTER — Ambulatory Visit: Payer: Self-pay

## 2022-07-30 VITALS — BP 110/78 | HR 50 | Ht 64.0 in | Wt 169.0 lb

## 2022-07-30 DIAGNOSIS — M79671 Pain in right foot: Secondary | ICD-10-CM | POA: Diagnosis not present

## 2022-07-30 DIAGNOSIS — M722 Plantar fascial fibromatosis: Secondary | ICD-10-CM

## 2022-07-30 DIAGNOSIS — G8929 Other chronic pain: Secondary | ICD-10-CM | POA: Diagnosis not present
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
## 2022-07-30 NOTE — Assessment & Plan Note (Signed)
Patient given another injection today.  Has been 6 months since we have injected her.  We discussed with her about continuing issues.  Does have a moderate plantar heel that could be contributing as well.  We did discuss the possibility of PRP as a possibility as well.  If it is more in the plantar heel do need to consider the possibility of surgical intervention but hopefully this will be necessary.  Follow-up with me again in 8 weeks

## 2022-07-30 NOTE — Patient Instructions (Signed)
Injected heel today Read about PRP Send message if you want to do it sooner than 8 weeks See me in 8-12 weeks

## 2022-09-04 ENCOUNTER — Encounter: Payer: Self-pay | Admitting: Family Medicine

## 2022-09-04 DIAGNOSIS — R1030 Lower abdominal pain, unspecified: Secondary | ICD-10-CM | POA: Diagnosis not present

## 2022-09-05 ENCOUNTER — Encounter: Payer: Self-pay | Admitting: Nurse Practitioner

## 2022-09-05 ENCOUNTER — Telehealth (INDEPENDENT_AMBULATORY_CARE_PROVIDER_SITE_OTHER): Payer: No Typology Code available for payment source | Admitting: Nurse Practitioner

## 2022-09-05 DIAGNOSIS — K5790 Diverticulosis of intestine, part unspecified, without perforation or abscess without bleeding: Secondary | ICD-10-CM | POA: Diagnosis not present
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
## 2022-09-05 NOTE — Patient Instructions (Signed)
,   Kimberly [name], thank you for joining Kimberly Pretty, FNP for today's virtual visit.  While this provider is not your primary care provider (PCP), if your PCP is located in our provider database this encounter information will be shared with them immediately following your visit.   [facility] account gives you access to today's visit and all your visits, tests, and labs performed at [facility] " click here if you don't have a [facility] account or go to [email or web address]  Consent: (Patient) Kimberly [name] provided verbal consent for this virtual visit at the beginning of the encounter.  Current Medications:  Current Outpatient Medications:    citalopram (CELEXA) 10 MG tablet, Take 1 tablet (10 mg total) by mouth daily., Disp: 90 tablet, Rfl: 3   Lactobacillus (PROBIOTIC ACIDOPHILUS PO), Take by mouth., Disp: , Rfl:    levothyroxine (SYNTHROID) 25 MCG tablet, Take 1 tablet (25 mcg total) by mouth daily before breakfast., Disp: 90 tablet, Rfl: 3   loperamide (IMODIUM A-D) 2 MG tablet, Take 1 tablet (2 mg total) by mouth 4 (four) times daily as needed for diarrhea or loose stools., Disp: 30 tablet, Rfl: 0   Melatonin 5 MG CHEW, Chew by mouth., Disp: , Rfl:    rosuvastatin (CRESTOR) 10 MG tablet, Take 1 tablet (10 mg total) by mouth daily., Disp: 90 tablet, Rfl: 3   Vitamin D, Ergocalciferol, (DRISDOL) 1.25 MG (50000 UNIT) CAPS capsule, Take 1 capsule (50,000 Units total) by mouth every 7 (seven) days., Disp: 12 capsule, Rfl: 3   Medications ordered in this encounter:  No orders of the defined types were placed in this encounter.    *If you need refills on other medications prior to your next appointment, please contact your pharmacy*  Follow-Up: Call back or seek an in-person evaluation if the symptoms worsen or if the condition fails to improve as anticipated.  [place] [phone]  Other Instructions Handout on  diverticular disease   If you have been instructed to have an in-person evaluation today at a local Urgent Care facility, please use the link below. It will take you to a list of all of our available [facility], including address, phone number and hours of operation. Please do not delay care.  [facility]  If you or a family member do not have a primary care provider, use the link below to schedule a visit and establish care. When you choose a [facility] primary care physician or advanced practice provider, you gain a long-term partner in health. Find a Primary Care Provider  Learn more about [facility]'s in-office and virtual care options: [facility]

## 2022-09-17 ENCOUNTER — Encounter: Payer: Self-pay | Admitting: Family Medicine
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
## 2022-09-23 NOTE — Progress Notes (Unsigned)
  [place] Phone: [phone] Subjective:   Kimberly [name], am serving as a scribe for Dr. Hulan Saas.  I'm seeing this patient by the request  of:  Janora Norlander, DO  CC: Foot pain follow-up  [id number]  07/30/2022 Patient given another injection today.  Has been 6 months since we have injected her.  We discussed with her about continuing issues.  Does have a moderate plantar heel that could be contributing as well.  We did discuss the possibility of PRP as a possibility as well.  If it is more in the plantar heel do need to consider the possibility of surgical intervention but hopefully this will be necessary.  Follow-up with me again in 8 weeks      Update 09/24/2022 Kimberly [name] is a 72 y.o. female coming in with complaint of R heel pain. Patient states that she continues to have pain in her foot. Been able to manage pain her pain more so than previously.       Objective  Height '5\' 4"'$  (1.626 m), weight 169 lb (76.7 kg).   General: [name] apparent distress alert and oriented x3 mood and affect normal, dressed appropriately.   Procedure: Real-time Ultrasound Guided Injection of right plantar fascia Device: GE Logiq Q7 Ultrasound guided injection is preferred based studies that show increased duration, increased effect, greater accuracy, decreased procedural pain, increased response rate, and decreased cost with ultrasound guided versus blind injection.  Verbal informed consent obtained.  Time-out conducted.  Noted [name] overlying erythema, induration, or other signs of local infection.  Skin prepped in a sterile fashion.  Local anesthesia: Topical Ethyl chloride.  With sterile technique and under real time ultrasound guidance: With a 21-gauge 2 inch needle from the medial approach patient was given an injection of 0.2 cc of 0.5% Marcaine and then injected with 4 cc of PRP Completed without difficulty    Advised to call if fevers/chills, erythema, induration, drainage, or persistent bleeding.  Impression: Technically successful ultrasound guided injection.    Impression and Recommendations:     The above documentation has been reviewed and is accurate and complete Lyndal Pulley, DO

## 2022-09-24 ENCOUNTER — Encounter: Payer: Self-pay | Admitting: Family Medicine

## 2022-09-24 ENCOUNTER — Ambulatory Visit: Payer: No Typology Code available for payment source | Admitting: Family Medicine

## 2022-09-24 ENCOUNTER — Ambulatory Visit: Payer: Self-pay

## 2022-09-24 ENCOUNTER — Ambulatory Visit: Payer: Self-pay | Admitting: Family Medicine

## 2022-09-24 ENCOUNTER — Ambulatory Visit (INDEPENDENT_AMBULATORY_CARE_PROVIDER_SITE_OTHER): Payer: No Typology Code available for payment source

## 2022-09-24 VITALS — Ht 64.0 in | Wt 169.0 lb

## 2022-09-24 VITALS — BP 110/70 | HR 64 | Ht 64.0 in | Wt 169.0 lb

## 2022-09-24 DIAGNOSIS — M4316 Spondylolisthesis, lumbar region: Secondary | ICD-10-CM

## 2022-09-24 DIAGNOSIS — M545 Low back pain, unspecified: Secondary | ICD-10-CM

## 2022-09-24 DIAGNOSIS — M722 Plantar fascial fibromatosis: Secondary | ICD-10-CM

## 2022-09-24 DIAGNOSIS — M79671 Pain in right foot: Secondary | ICD-10-CM

## 2022-09-24 NOTE — Assessment & Plan Note (Signed)
Therapy given today, tolerated the procedure well, discussed icing regimen and home exercises, we discussed with patient about post PRP as well.

## 2022-09-24 NOTE — Patient Instructions (Addendum)
No ice or IBU for 3 days Heat and Tylenol are ok See me again in 6-8 weeks

## 2022-09-24 NOTE — Assessment & Plan Note (Signed)
Patient does have previous compression fractures at the T10 and T11 areas.  This is noted on x-ray today.  Some arthritic changes noted with some facet arthropathy of the lumbar spine.  Does have L4 on L5 spondylolisthesis noted.  Discussed that this could be more of a nerve impingement that could be contributing.  Discussed different medications including the possibility of gabapentin.  Patient wanted to start with home exercises and work with Product/process development scientist.  Could be a candidate potentially for osteopathic manipulation in the long run.  Patient will start with the increase in activity and follow-up with me again in 6 to 8 weeks

## 2022-10-14 ENCOUNTER — Other Ambulatory Visit: Payer: Self-pay | Admitting: *Deleted

## 2022-10-14 DIAGNOSIS — E7800 Pure hypercholesterolemia, unspecified: Secondary | ICD-10-CM

## 2022-10-14 MED ORDER — ROSUVASTATIN CALCIUM 10 MG PO TABS: 10.0000 mg | ORAL_TABLET | Freq: Every day | ORAL | 1 refills | Status: DC
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
## 2022-11-10 ENCOUNTER — Other Ambulatory Visit: Payer: Self-pay

## 2022-11-10 ENCOUNTER — Encounter: Payer: Self-pay | Admitting: Family Medicine

## 2022-11-10 ENCOUNTER — Ambulatory Visit: Payer: No Typology Code available for payment source | Admitting: Family Medicine

## 2022-11-10 VITALS — BP 118/78 | HR 61 | Ht 64.0 in | Wt 172.0 lb

## 2022-11-10 DIAGNOSIS — M9908 Segmental and somatic dysfunction of rib cage: Secondary | ICD-10-CM

## 2022-11-10 DIAGNOSIS — M4316 Spondylolisthesis, lumbar region: Secondary | ICD-10-CM

## 2022-11-10 DIAGNOSIS — M9904 Segmental and somatic dysfunction of sacral region: Secondary | ICD-10-CM | POA: Diagnosis not present

## 2022-11-10 DIAGNOSIS — M79671 Pain in right foot: Secondary | ICD-10-CM | POA: Diagnosis not present

## 2022-11-10 DIAGNOSIS — M9903 Segmental and somatic dysfunction of lumbar region: Secondary | ICD-10-CM | POA: Diagnosis not present

## 2022-11-10 DIAGNOSIS — M9902 Segmental and somatic dysfunction of thoracic region: Secondary | ICD-10-CM

## 2022-11-10 MED ORDER — PREDNISONE 20 MG PO TABS: 20.0000 mg | ORAL_TABLET | Freq: Every day | ORAL | 0 refills | Status: DC

## 2022-11-10 NOTE — Assessment & Plan Note (Signed)
X-rays show a spondylolisthesis.  There could consider advanced imaging if any radicular symptoms starts to occur.  Patient has elected to try osteopathic manipulation and discussed which activities to do and which ones to avoid.  Increase activity slowly.  Follow-up again in 6 to 8 weeks

## 2022-11-10 NOTE — Assessment & Plan Note (Signed)
Improvement noted.  Discussed with patient about proper shoes, short course of prednisone secondary to the irritation of the fat pad that I think will be helpful.  Follow-up again in 6 to 8 weeks

## 2022-11-10 NOTE — Patient Instructions (Addendum)
Prednisone  for 5 days Otherwise Shoes are perfect Tried manipulation  See you again in 6-8 weeks

## 2022-11-12 ENCOUNTER — Ambulatory Visit (INDEPENDENT_AMBULATORY_CARE_PROVIDER_SITE_OTHER): Payer: No Typology Code available for payment source

## 2022-11-12 ENCOUNTER — Encounter: Payer: Self-pay | Admitting: Family Medicine

## 2022-11-12 ENCOUNTER — Ambulatory Visit (INDEPENDENT_AMBULATORY_CARE_PROVIDER_SITE_OTHER): Payer: No Typology Code available for payment source | Admitting: Family Medicine

## 2022-11-12 VITALS — BP 114/72 | HR 71 | Temp 97.9°F | Ht 64.0 in | Wt 168.0 lb

## 2022-11-12 DIAGNOSIS — K5790 Diverticulosis of intestine, part unspecified, without perforation or abscess without bleeding: Secondary | ICD-10-CM | POA: Diagnosis not present

## 2022-11-12 DIAGNOSIS — R109 Unspecified abdominal pain: Secondary | ICD-10-CM | POA: Diagnosis not present
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
## 2022-11-25 ENCOUNTER — Ambulatory Visit (INDEPENDENT_AMBULATORY_CARE_PROVIDER_SITE_OTHER): Payer: No Typology Code available for payment source | Admitting: Nurse Practitioner

## 2022-11-25 ENCOUNTER — Encounter: Payer: Self-pay | Admitting: Nurse Practitioner

## 2022-11-25 VITALS — BP 126/64 | HR 78 | Temp 97.8°F | Resp 20 | Ht 64.0 in | Wt 170.0 lb

## 2022-11-25 DIAGNOSIS — L247 Irritant contact dermatitis due to plants, except food: Secondary | ICD-10-CM

## 2022-11-25 MED ORDER — METHYLPREDNISOLONE ACETATE 80 MG/ML IJ SUSP
80.0000 mg | Freq: Once | INTRAMUSCULAR | Status: AC
  Administered 2022-11-25: 80 mg via INTRAMUSCULAR

## 2022-11-25 NOTE — Progress Notes (Signed)
   Subjective:    Patient ID: Kimberly [name], female    DOB: 1951/07/04, 72 y.o.   MRN: [id number]   Chief Complaint: Poison Oak   HPI  Patient has been doing yard work and she got into some poison oak. Is spreading up arms and is very itchy. She has been using steroid cream and taking banadryl.  Patient Active Problem List   Diagnosis Date Noted   Spondylolisthesis of lumbar region 09/24/2022   Diverticular disease 09/05/2022   History of spinal fracture 09/13/2021   Chronic pain of left knee 09/13/2021   Vitamin D deficiency 09/13/2021   Pure hypercholesterolemia 09/13/2021   Acquired hypothyroidism 03/13/2021   Plantar fasciitis, right 03/13/2021   Greater trochanteric bursitis, left 01/01/2021   Incontinence of feces 11/23/2020   Pain of right heel 11/13/2020   Varicose veins of both lower extremities without ulcer or inflammation 02/20/2020   Left ACL tear 01/25/2019   Degenerative arthritis of left knee 01/25/2019   Neoplasm of uncertain behavior of skin 03/20/2017   Lower back pain 03/07/2015   Osteopenia 12/21/2013   Recurrent depression (HCC) 01/05/2013       Review of Systems  Constitutional:  Negative for diaphoresis.  Eyes:  Negative for pain.  Respiratory:  Negative for shortness of breath.   Cardiovascular:  Negative for chest pain, palpitations and leg swelling.  Gastrointestinal:  Negative for abdominal pain.  Endocrine: Negative for polydipsia.  Skin:  Negative for rash.  Neurological:  Negative for dizziness, weakness and headaches.  Hematological:  Does not bruise/bleed easily.  All other systems reviewed and are negative.      Objective:   Physical Exam Constitutional:      Appearance: Normal appearance.  Cardiovascular:     Rate and Rhythm: Normal rate and regular rhythm.     Heart sounds: Normal heart sounds.  Pulmonary:     Breath sounds: Normal breath sounds.  Skin:    Findings: Rash (tiny vesicular lesions in linear pattern on bil  forearms.) present.  Neurological:     General: No focal deficit present.     Mental Status: She is alert and oriented to person, place, and time.  Psychiatric:        Mood and Affect: Mood normal.        Behavior: Behavior normal.    BP 126/64   Pulse 78   Temp 97.8 F (36.6 C) (Temporal)   Resp 20   Ht 5\' 4"  (1.626 m)   Wt 170 lb (77.1 kg)   SpO2 99%   BMI 29.18 kg/m         Assessment & Plan:  Laurelyn Sickle in today with chief complaint of Poison Oak   1. Irritant contact dermatitis due to plants, except food Continue cool compresses Steroid cream Continue benadryl as needed RTO prn - methylPREDNISolone acetate (DEPO-MEDROL) injection 80 mg    The above assessment and management plan was discussed with the patient. The patient verbalized understanding of and has agreed to the management plan. Patient is aware to call the clinic if symptoms persist or worsen. Patient is aware when to return to the clinic for a follow-up visit. Patient educated on when it is appropriate to go to the emergency department.   Mary-Margaret Daphine Deutscher, FNP

## 2022-11-25 NOTE — Patient Instructions (Signed)
Poison Oak Dermatitis  Poison oak dermatitis is irritation and swelling (inflammation) of the skin. This is caused by chemicals in the leaves of the poison oak plant. You may have very bad itching, swelling, redness, a rash, and blisters. What are the causes? You may get this condition by: Touching a poison oak plant. Touching something that has the chemical from the leaves on it. This may include animals or objects that have come in contact with the plant. What increases the risk? You are more likely to get this condition if you: Go outdoors often in wooded or marshy areas. Go outdoors without wearing protective clothing, such as closed shoes, long pants, and a long-sleeved shirt. What are the signs or symptoms? Symptoms of this condition include: Redness of the skin. Very bad itching. A rash that often includes bumps and blisters. The rash usually appears 48 hours after exposure if you have been exposed before. If this is the first time you have been exposed, the rash may not appear until a week after exposure. Swelling. This may occur if the reaction is very bad. Symptoms often clear up in 1-2 weeks. The first time you get this condition, symptoms may last 3-4 weeks. How is this treated? This condition may be treated with: Hydrocortisone creams or calamine lotions to help with itching. Oatmeal baths to soothe the skin. Medicines to help reduce itching (antihistamines). If you have a very bad reaction, you may also be given steroid medicines. Follow these instructions at home: Medicines Take or apply over-the-counter and prescription medicines only as told by your doctor. Use hydrocortisone creams or calamine lotion as needed to help with itching. General instructions Do not scratch or rub your skin. Put a cold, wet cloth (cold compress) on the affected areas or take baths in cool water. This will help with itching. Avoid hot baths and showers. Take oatmeal baths as needed. Use  colloidal oatmeal. You can get this at a pharmacy or grocery store. Follow the instructions on the package. While you have the rash, wash your clothes right after you wear them. Check the affected area every day for signs of infection. Check for: More redness, swelling, or pain. Fluid or blood. Warmth. Pus or a bad smell. Keep all follow-up visits. Your doctor may want to see how your skin is progressing with treatment. How is this prevented?  Know what poison oak looks like so you can avoid it. This plant has three leaves with flowering branches on a single stem. The leaves are fuzzy. The edges of the leaves look like teeth. If you have touched poison oak, wash your skin with soap and water right away. Be sure to wash under your fingernails. When hiking or camping, wear long pants, a long-sleeved shirt, long socks, and hiking boots. You can also use a lotion on your skin that helps prevent contact with the chemical on the plant. If you think that your clothes or outdoor gear came in contact with poison oak, rinse them off with a garden hose before you bring them inside your house. When doing yard work or gardening, wear gloves, long sleeves, long pants, and boots. Wash your garden tools and gloves if they come in contact with poison oak. If you think that your pet has come into contact with poison oak, wash them with pet shampoo and water. Make sure you wear gloves while washing your pet. Do not burn poison oak plants. This can release the chemical from the plant into the air and  may cause a reaction. Contact a doctor if: You have open sores in the rash area. You have any signs of infection. You have redness that spreads beyond the rash area. You have a fever. You have a rash over a large area of your body. You have a rash on your eyes, mouth, or genitals. Your rash does not improve after a few weeks. Get help right away if: Your face swells or your eyes swell shut. You have trouble  breathing. You have trouble swallowing. These symptoms may be an emergency. Do not wait to see if the symptoms will go away. Get help right away. Call 911. This information is not intended to replace advice given to you by your health care provider. Make sure you discuss any questions you have with your health care provider. Document Revised: 12/05/2021 Document Reviewed: 12/05/2021 Elsevier Patient Education  2023 [employer].

## 2022-12-13 ENCOUNTER — Encounter: Payer: Self-pay | Admitting: Family Medicine

## 2022-12-16 ENCOUNTER — Encounter: Payer: Self-pay | Admitting: Nurse Practitioner

## 2022-12-16 ENCOUNTER — Ambulatory Visit: Payer: No Typology Code available for payment source | Admitting: Nurse Practitioner

## 2022-12-16 VITALS — BP 121/79 | HR 61 | Temp 97.6°F | Ht 64.0 in | Wt 171.4 lb

## 2022-12-16 DIAGNOSIS — S81801A Unspecified open wound, right lower leg, initial encounter: Secondary | ICD-10-CM

## 2022-12-16 DIAGNOSIS — Z23 Encounter for immunization: Secondary | ICD-10-CM | POA: Diagnosis not present

## 2022-12-16 DIAGNOSIS — W548XXA Other contact with dog, initial encounter: Secondary | ICD-10-CM

## 2022-12-16 MED ORDER — SULFAMETHOXAZOLE-TRIMETHOPRIM 800-160 MG PO TABS: 1.0000 | ORAL_TABLET | Freq: Two times a day (BID) | ORAL | 0 refills | Status: DC

## 2022-12-16 NOTE — Progress Notes (Addendum)
   Subjective:    Patient ID: Kimberly [name], female    DOB: 19-Sep-1950, 72 y.o.   MRN: [id number]   Chief Complaint: Wound Infection   HPI  Patient was scratched on her right shin by her dog. Patient cleaned and but butterfly strips. Area is painful and slightly red.  Patient Active Problem List   Diagnosis Date Noted   Spondylolisthesis of lumbar region 09/24/2022   Diverticular disease 09/05/2022   History of spinal fracture 09/13/2021   Chronic pain of left knee 09/13/2021   Vitamin D deficiency 09/13/2021   Pure hypercholesterolemia 09/13/2021   Acquired hypothyroidism 03/13/2021   Plantar fasciitis, right 03/13/2021   Greater trochanteric bursitis, left 01/01/2021   Incontinence of feces 11/23/2020   Pain of right heel 11/13/2020   Varicose veins of both lower extremities without ulcer or inflammation 02/20/2020   Left ACL tear 01/25/2019   Degenerative arthritis of left knee 01/25/2019   Neoplasm of uncertain behavior of skin 03/20/2017   Lower back pain 03/07/2015   Osteopenia 12/21/2013   Recurrent depression (HCC) 01/05/2013       Review of Systems  Constitutional:  Negative for diaphoresis.  Eyes:  Negative for pain.  Respiratory:  Negative for shortness of breath.   Cardiovascular:  Negative for chest pain, palpitations and leg swelling.  Gastrointestinal:  Negative for abdominal pain.  Endocrine: Negative for polydipsia.  Skin:  Negative for rash.  Neurological:  Negative for dizziness, weakness and headaches.  Hematological:  Does not bruise/bleed easily.  All other systems reviewed and are negative.      Objective:   Physical Exam Constitutional:      Appearance: Normal appearance.  Cardiovascular:     Rate and Rhythm: Normal rate and regular rhythm.  Pulmonary:     Effort: Pulmonary effort is normal.     Breath sounds: Normal breath sounds.  Skin:    General: Skin is warm.     Comments: 3cm wound to right sin. Butterfly bandage. Wound edges  well approximated. No erythema or edema.  Neurological:     General: No focal deficit present.     Mental Status: She is alert and oriented to person, place, and time.  Psychiatric:        Mood and Affect: Mood normal.        Behavior: Behavior normal.    BP 121/79   Pulse 61   Temp 97.6 F (36.4 C) (Temporal)   Ht 5\' 4"  (1.626 m)   Wt 171 lb 6 oz (77.7 kg)   SpO2 100%   BMI 29.42 kg/m         Assessment & Plan:   Kimberly [name] in today with chief complaint of Wound Infection   1. Dog scratch Keep clean and dry May soak off steri strips tomorrow.  Triple antibiotic ointment TD  today - sulfamethoxazole-trimethoprim (BACTRIM DS) 800-160 MG tablet; Take 1 tablet by mouth 2 (two) times daily.  Dispense: 20 tablet; Refill: 0    The above assessment and management plan was discussed with the patient. The patient verbalized understanding of and has agreed to the management plan. Patient is aware to call the clinic if symptoms persist or worsen. Patient is aware when to return to the clinic for a follow-up visit. Patient educated on when it is appropriate to go to the emergency department.   Mary-Margaret Daphine Deutscher, FNP

## 2022-12-16 NOTE — Addendum Note (Signed)
Addended by: Hessie Diener on: 12/16/2022 12:27 PM   Modules accepted: Orders

## 2022-12-16 NOTE — Patient Instructions (Signed)
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
## 2022-12-30 ENCOUNTER — Ambulatory Visit (INDEPENDENT_AMBULATORY_CARE_PROVIDER_SITE_OTHER): Payer: No Typology Code available for payment source | Admitting: Family Medicine

## 2022-12-30 ENCOUNTER — Ambulatory Visit (INDEPENDENT_AMBULATORY_CARE_PROVIDER_SITE_OTHER): Payer: No Typology Code available for payment source

## 2022-12-30 ENCOUNTER — Encounter: Payer: Self-pay | Admitting: Family Medicine

## 2022-12-30 VITALS — BP 104/62 | HR 64 | Ht 64.0 in | Wt 170.0 lb

## 2022-12-30 DIAGNOSIS — M9904 Segmental and somatic dysfunction of sacral region: Secondary | ICD-10-CM

## 2022-12-30 DIAGNOSIS — M9901 Segmental and somatic dysfunction of cervical region: Secondary | ICD-10-CM | POA: Diagnosis not present

## 2022-12-30 DIAGNOSIS — M9902 Segmental and somatic dysfunction of thoracic region: Secondary | ICD-10-CM | POA: Diagnosis not present

## 2022-12-30 DIAGNOSIS — M545 Low back pain, unspecified: Secondary | ICD-10-CM | POA: Diagnosis not present

## 2022-12-30 DIAGNOSIS — M9903 Segmental and somatic dysfunction of lumbar region: Secondary | ICD-10-CM

## 2022-12-30 DIAGNOSIS — M9908 Segmental and somatic dysfunction of rib cage: Secondary | ICD-10-CM | POA: Diagnosis not present

## 2022-12-30 DIAGNOSIS — M542 Cervicalgia: Secondary | ICD-10-CM

## 2022-12-30 DIAGNOSIS — G8929 Other chronic pain: Secondary | ICD-10-CM | POA: Diagnosis not present

## 2022-12-30 NOTE — Patient Instructions (Signed)
Good to see you! Xrays today See you again in 6 weeks

## 2022-12-30 NOTE — Assessment & Plan Note (Signed)
Chronic problem with worsening symptoms.  Does have significant degenerative disc disease.  The patient is still needing to avoid repetitive flexion noted.  Discussed with patient about the hip flexor stretches that could be more beneficial as well.  Discussed other ergonomics that I think would be beneficial as well.  Follow-up again in 6 to 8 weeks otherwise.

## 2023-01-23 ENCOUNTER — Other Ambulatory Visit: Payer: Self-pay | Admitting: Family Medicine

## 2023-01-23 DIAGNOSIS — E559 Vitamin D deficiency, unspecified: Secondary | ICD-10-CM
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
## 2023-02-03 ENCOUNTER — Ambulatory Visit: Payer: No Typology Code available for payment source | Admitting: Family Medicine

## 2023-02-03 ENCOUNTER — Encounter: Payer: Self-pay | Admitting: Family Medicine

## 2023-02-03 VITALS — BP 122/72 | Ht 64.0 in | Wt 170.0 lb

## 2023-02-03 DIAGNOSIS — M9903 Segmental and somatic dysfunction of lumbar region: Secondary | ICD-10-CM | POA: Diagnosis not present

## 2023-02-03 DIAGNOSIS — G8929 Other chronic pain: Secondary | ICD-10-CM

## 2023-02-03 DIAGNOSIS — M9904 Segmental and somatic dysfunction of sacral region: Secondary | ICD-10-CM | POA: Diagnosis not present

## 2023-02-03 DIAGNOSIS — M545 Low back pain, unspecified: Secondary | ICD-10-CM

## 2023-02-03 DIAGNOSIS — M9902 Segmental and somatic dysfunction of thoracic region: Secondary | ICD-10-CM

## 2023-02-03 NOTE — Patient Instructions (Signed)
Good to see you! Thanks for feeding me Keep up the hard work and enjoy the mountains See you again in 2-3 months

## 2023-02-03 NOTE — Assessment & Plan Note (Signed)
Low back pain noted.  Spondylolisthesis of the lumbar spine.  Female that seems to be significantly unstable.  Responding relatively well to osteopathic manipulation.  Discussed icing regimen and home exercises, discussed which activities to do and which ones to avoid.  Increase activity slowly.  Follow-up with me again in 6 to 8 weeks.

## 2023-02-23 ENCOUNTER — Other Ambulatory Visit: Payer: Self-pay | Admitting: Family Medicine

## 2023-02-23 DIAGNOSIS — E559 Vitamin D deficiency, unspecified: Secondary | ICD-10-CM

## 2023-02-23 NOTE — Telephone Encounter (Signed)
Don't see where she has scheduled her physical which is due soon.  I have future orders in for labs from last year. Can she at least come do those before we refill?

## 2023-02-25 NOTE — Telephone Encounter (Signed)
Pt scheduled for labs tomorrow. CPE scheduled for Oct. Pt aware no refills before labs are drawn per Gottschalk's message.

## 2023-02-26 ENCOUNTER — Other Ambulatory Visit

## 2023-02-26 DIAGNOSIS — N179 Acute kidney failure, unspecified: Secondary | ICD-10-CM

## 2023-02-26 DIAGNOSIS — E7800 Pure hypercholesterolemia, unspecified: Secondary | ICD-10-CM | POA: Diagnosis not present

## 2023-02-26 DIAGNOSIS — E559 Vitamin D deficiency, unspecified: Secondary | ICD-10-CM

## 2023-02-27 LAB — LIPID PANEL
Chol/HDL Ratio: 2 {ratio} (ref 0.0–4.4)
Cholesterol, Total: 190 mg/dL (ref 100–199)
HDL: 95 mg/dL (ref >39)
LDL Chol Calc (NIH): 84 mg/dL (ref 0–99)
Triglycerides: 56 mg/dL (ref 0–149)
VLDL Cholesterol Cal: 11 mg/dL (ref 5–40)

## 2023-02-27 LAB — CMP14+EGFR
ALT: 10 [IU]/L (ref 0–32)
AST: 14 [IU]/L (ref 0–40)
Albumin: 3.9 g/dL (ref 3.8–4.8)
Alkaline Phosphatase: 120 [IU]/L (ref 44–121)
BUN/Creatinine Ratio: 19 (ref 12–28)
BUN: 20 mg/dL (ref 8–27)
Bilirubin Total: 0.3 mg/dL (ref 0.0–1.2)
CO2: 23 mmol/L (ref 20–29)
Calcium: 8.8 mg/dL (ref 8.7–10.3)
Chloride: 106 mmol/L (ref 96–106)
Creatinine, Ser: 1.07 mg/dL — ABNORMAL HIGH (ref 0.57–1.00)
Globulin, Total: 2.3 g/dL (ref 1.5–4.5)
Glucose: 86 mg/dL (ref 70–99)
Potassium: 4.9 mmol/L (ref 3.5–5.2)
Sodium: 141 mmol/L (ref 134–144)
Total Protein: 6.2 g/dL (ref 6.0–8.5)
eGFR: 55 mL/min/{1.73_m2} — ABNORMAL LOW (ref >59)

## 2023-02-27 LAB — VITAMIN D 25 HYDROXY (VIT D DEFICIENCY, FRACTURES): Vit D, 25-Hydroxy: 57.8 ng/mL (ref 30.0–100.0)

## 2023-03-02 ENCOUNTER — Encounter: Payer: Self-pay | Admitting: Family Medicine

## 2023-03-02 NOTE — Telephone Encounter (Signed)
Just put her in for a normal OV that we will dedicate to her acute issue.  Keep her PE wherever it is

## 2023-03-05 NOTE — Patient Instructions (Addendum)
Our records indicate that you are due for your screening mammogram.  Please call the imaging center that does your yearly mammograms to make an appointment for a mammogram at your earliest convenience. Our office also has a mobile unit through the [facility] that comes to our location. Please call our office if you would like to make an appointment.  Raynaud's Phenomenon  Raynaud's phenomenon is a condition that affects the blood vessels (arteries) that carry blood to the fingers and toes. The arteries that supply blood to the ears, lips, nipples, or the tip of the nose might also be affected. Raynaud's phenomenon causes the arteries to become narrow temporarily (spasm). As a result, the flow of blood to the affected areas is temporarily decreased. This usually occurs in response to cold temperatures or stress. During an attack, the skin in the affected areas turns white, then blue, and finally red. A person may also feel tingling or numbness in those areas. Attacks usually last for only a brief period, and then the blood flow to the area returns to normal. In most cases, Raynaud's phenomenon does not cause serious health problems. What are the causes? In many cases, the cause of this condition is not known. The condition may occur on its own (primary Raynaud's phenomenon) or may be associated with other diseases or factors (secondary Raynaud's phenomenon). Possible causes may include: Diseases or medical conditions that damage the arteries. Injuries and repetitive actions that hurt the hands or feet. Being exposed to certain chemicals. Taking medicines that narrow the arteries. Other medical conditions, such as lupus, scleroderma, rheumatoid arthritis, thyroid problems, blood disorders, Sjogren syndrome, or atherosclerosis. What increases the risk? The following factors may make you more likely to develop this condition: Being 49-15 years old. Being female. Having a family  history of Raynaud's phenomenon. Living in a cold climate. Smoking. What are the signs or symptoms? Symptoms of this condition usually occur when you are exposed to cold temperatures or when you have emotional stress. The symptoms may last for a few minutes or up to several hours. They usually affect your fingers but may also affect your toes, nipples, lips, ears, or the tip of your nose. Symptoms may include: Changes in skin color. The skin in the affected areas will turn pale or white. The skin may then change from white to bluish to red as normal blood flow returns to the area. Numbness, tingling, or pain in the affected areas. In severe cases, symptoms may include: Skin sores. Tissues decaying and dying (gangrene). How is this diagnosed? This condition may be diagnosed based on: Your symptoms and medical history. A physical exam. During the exam, you may be asked to put your hands in cold water to check for a reaction to cold temperature. Tests, such as: Blood tests to check for other diseases or conditions. A test to check the movement of blood through your arteries and veins (vascular ultrasound). A test in which the skin at the base of your fingernail is examined under a microscope (nailfold capillaroscopy). How is this treated? During an episode, you can take actions to help symptoms go away faster. Options include moving your arms around in a windmill pattern, warming your fingers under warm water, or placing your fingers in a warm body fold, such as your armpit. Long-term treatment for this condition often involves making lifestyle changes and taking steps to control your exposure to cold temperature. For more severe cases, medicine (calcium channel blockers) may be  used to improve blood circulation. Follow these instructions at home: Avoiding cold temperatures Take these steps to avoid exposure to cold: If possible, stay indoors during cold weather. When you go outside during cold  weather, dress in layers and wear mittens, a hat, a scarf, and warm footwear. Wear mittens or gloves when handling ice or frozen food. Use holders for glasses or cans containing cold drinks. Let warm water run for a while before taking a shower or bath. Warm up the car before driving in cold weather. Lifestyle If possible, avoid stressful and emotional situations. Try to find ways to manage your stress, such as: Exercise. Yoga. Meditation. Biofeedback. Do not use any products that contain nicotine or tobacco. These products include cigarettes, chewing tobacco, and vaping devices, such as e-cigarettes. If you need help quitting, ask your health care provider. Avoid secondhand smoke. Limit your use of caffeine. Switch to decaffeinated coffee, tea, and soda. Avoid chocolate. Avoid vibrating tools and machinery. General instructions Protect your hands and feet from injuries, cuts, or bruises. Avoid wearing tight rings or wristbands. Wear loose fitting socks and comfortable, roomy shoes. Take over-the-counter and prescription medicines only as told by your health care provider. Where to find support Raynaud's Association: www.raynauds.org Where to find more information [employer] of Arthritis and Musculoskeletal and Skin Diseases: www.niams.http://www.myers.net/ Contact a health care provider if: Your discomfort becomes worse despite lifestyle changes. You develop sores on your fingers or toes that do not heal. You have breaks in the skin on your fingers or toes. You have a fever. You have pain or swelling in your joints. You have a rash. Your symptoms occur on only one side of your body. Get help right away if: Your fingers or toes turn black. You have severe pain in the affected areas. These symptoms may represent a serious problem that is an emergency. Do not wait to see if the symptoms will go away. Get medical help right away. Call your local emergency services (911 in the [place] Do  not drive yourself to the hospital. Summary Raynaud's phenomenon is a condition that affects the arteries that carry blood to the fingers, toes, ears, lips, nipples, or the tip of the nose. In many cases, the cause of this condition is not known. Symptoms of this condition include changes in skin color along with numbness and tingling in the affected area. Treatment for this condition includes lifestyle changes and reducing exposure to cold temperatures. Medicines may be used for severe cases of the condition. Contact your health care provider if your condition worsens despite treatment. This information is not intended to replace advice given to you by your health care provider. Make sure you discuss any questions you have with your health care provider. Document Revised: 09/11/2020 Document Reviewed: 09/11/2020 Elsevier Patient Education  2024 Else[employer] Inc.   Chronic Kidney Disease, Adult Chronic kidney disease is when lasting damage happens to the kidneys slowly over a long time. The kidneys help to: Make pee (urine). Make hormones. Keep the right amount of fluids and chemicals in the body. Most often, this disease does not go away. You must take steps to help keep the kidney damage from getting worse. If steps are not taken, the kidneys might stop working forever. What are the causes? Diabetes. High blood pressure. Diseases that affect the heart and blood vessels. Other kidney diseases. Diseases of the body's disease-fighting system. A problem with the flow of pee. Infections of the organs that make pee, store it, and take  it out of the body. Swelling or irritation of your blood vessels. What increases the risk? Getting older. Having someone in your family who has kidney disease or kidney failure. Having a disease caused by genes. Taking medicines often that harm the kidneys. Being near or having contact with harmful substances. Being very overweight. Using tobacco now or in  the past. What are the signs or symptoms? Feeling very tired. Having a swollen face, legs, ankles, or feet. Feeling like you may vomit or vomiting. Not feeling hungry. Being confused or not able to focus. Twitches and cramps in the leg muscles or other muscles. Dry, itchy skin. A taste of metal in your mouth. Making less pee, or making more pee. Shortness of breath. Trouble sleeping. You may also become anemic or get weak bones. Anemic means there is not enough red blood cells or hemoglobin in your blood. You may get symptoms slowly. You may not notice them until the kidney damage gets very bad. How is this treated? Often, there is no cure for this disease. Treatment can help with symptoms and help keep the disease from getting worse. You may need to: Avoid alcohol. Avoid foods that are high in salt, potassium, phosphorous, and protein. Take medicines for symptoms and to help control other conditions. Have dialysis. This treatment gets harmful waste out of your body. Treat other problems that cause your kidney disease or make it worse. Follow these instructions at home: Medicines Take over-the-counter and prescription medicines only as told by your doctor. Do not take any new medicines, vitamins, or supplements unless your doctor says it is okay. Lifestyle  Do not smoke or use any products that contain nicotine or tobacco. If you need help quitting, ask your doctor. If you drink alcohol: Limit how much you use to: 0-1 drink a day for women who are not pregnant. 0-2 drinks a day for men. Know how much alcohol is in your drink. In the [place] one drink equals one 12 oz bottle of beer (355 mL), one 5 oz glass of wine (148 mL), or one 1 oz glass of hard liquor (44 mL). Stay at a healthy weight. If you need help losing weight, ask your doctor. General instructions  Follow instructions from your doctor about what you cannot eat or drink. Track your blood pressure at home. Tell your  doctor about any changes. If you have diabetes, track your blood sugar. Exercise at least 30 minutes a day, 5 days a week. Keep your shots (vaccinations) up to date. Keep all follow-up visits. Where to find more information American Association of Kidney Patients: ResidentialShow.is [employer]: www.kidney.org American Kidney Fund: FightingMatch.com.ee Life Options: www.lifeoptions.org Kidney School: www.kidneyschool.org Contact a doctor if: Your symptoms get worse. You get new symptoms. Get help right away if: You get symptoms of end-stage kidney disease. These include: Headaches. Losing feeling in your hands or feet. Easy bruising. Having hiccups often. Chest pain. Shortness of breath. Lack of menstrual periods, in women. You have a fever. You make less pee than normal. You have pain or you bleed when you pee or poop. These symptoms may be an emergency. Get help right away. Call your local emergency services (911 in the [place] Do not wait to see if the symptoms will go away. Do not drive yourself to the hospital. Summary Chronic kidney disease is when lasting damage happens to the kidneys slowly over a long time. Causes of this disease include diabetes and high blood pressure. Often, there is no  cure for this disease. Treatment can help symptoms and help keep the disease from getting worse. Treatment may involve lifestyle changes, medicines, and dialysis. This information is not intended to replace advice given to you by your health care provider. Make sure you discuss any questions you have with your health care provider. Document Revised: 10/12/2019 Document Reviewed: 10/12/2019 Elsevier Patient Education  2024 [employer].

## 2023-03-09 ENCOUNTER — Encounter: Payer: Self-pay | Admitting: Family Medicine

## 2023-03-09 ENCOUNTER — Encounter

## 2023-03-09 ENCOUNTER — Ambulatory Visit (INDEPENDENT_AMBULATORY_CARE_PROVIDER_SITE_OTHER): Payer: No Typology Code available for payment source | Admitting: Family Medicine

## 2023-03-09 VITALS — BP 118/76 | HR 86 | Temp 98.6°F | Ht 64.0 in | Wt 173.0 lb

## 2023-03-09 DIAGNOSIS — N289 Disorder of kidney and ureter, unspecified: Secondary | ICD-10-CM

## 2023-03-09 DIAGNOSIS — I7300 Raynaud's syndrome without gangrene: Secondary | ICD-10-CM

## 2023-03-09 DIAGNOSIS — R3129 Other microscopic hematuria: Secondary | ICD-10-CM | POA: Diagnosis not present

## 2023-03-09 DIAGNOSIS — N1831 Chronic kidney disease, stage 3a: Secondary | ICD-10-CM | POA: Insufficient documentation

## 2023-03-09 LAB — URINALYSIS
Bilirubin, UA: NEGATIVE
Glucose, UA: NEGATIVE
Ketones, UA: NEGATIVE
Leukocytes,UA: NEGATIVE
Nitrite, UA: NEGATIVE
Protein,UA: NEGATIVE
Specific Gravity, UA: 1.025 (ref 1.005–1.030)
Urobilinogen, Ur: 0.2 mg/dL (ref 0.2–1.0)
pH, UA: 5.5 (ref 5.0–7.5)

## 2023-03-09 LAB — URINALYSIS, MICROSCOPIC ONLY
Bacteria, UA: NONE SEEN
Renal Epithel, UA: NONE SEEN /hpf
Yeast, UA: NONE SEEN
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
## 2023-03-10 ENCOUNTER — Other Ambulatory Visit: Payer: Self-pay | Admitting: Family Medicine

## 2023-03-10 DIAGNOSIS — N1831 Chronic kidney disease, stage 3a: Secondary | ICD-10-CM

## 2023-03-10 LAB — RENAL FUNCTION PANEL
Albumin: 4 g/dL (ref 3.8–4.8)
BUN/Creatinine Ratio: 14 (ref 12–28)
BUN: 16 mg/dL (ref 8–27)
CO2: 22 mmol/L (ref 20–29)
Calcium: 8.8 mg/dL (ref 8.7–10.3)
Chloride: 104 mmol/L (ref 96–106)
Creatinine, Ser: 1.18 mg/dL — ABNORMAL HIGH (ref 0.57–1.00)
Glucose: 75 mg/dL (ref 70–99)
Phosphorus: 3.5 mg/dL (ref 3.0–4.3)
Potassium: 4 mmol/L (ref 3.5–5.2)
Sodium: 141 mmol/L (ref 134–144)
eGFR: 49 mL/min/{1.73_m2} — ABNORMAL LOW (ref >59)

## 2023-03-10 LAB — MICROALBUMIN / CREATININE URINE RATIO
Creatinine, Urine: 185.6 mg/dL
Microalb/Creat Ratio: 7 mg/g{creat} (ref 0–29)
Microalbumin, Urine: 12.8 ug/mL

## 2023-03-11 ENCOUNTER — Encounter: Payer: Self-pay | Admitting: Family Medicine

## 2023-03-11 LAB — URINE CULTURE

## 2023-03-13 ENCOUNTER — Encounter: Payer: Self-pay | Admitting: Family Medicine

## 2023-03-13 NOTE — Telephone Encounter (Signed)
Can you please reroute her renal referral to [place]?

## 2023-03-18 ENCOUNTER — Ambulatory Visit (INDEPENDENT_AMBULATORY_CARE_PROVIDER_SITE_OTHER): Payer: No Typology Code available for payment source

## 2023-03-18 VITALS — Ht 64.0 in | Wt 170.0 lb

## 2023-03-18 DIAGNOSIS — Z0000 Encounter for general adult medical examination without abnormal findings: Secondary | ICD-10-CM

## 2023-03-18 DIAGNOSIS — Z1231 Encounter for screening mammogram for malignant neoplasm of breast: Secondary | ICD-10-CM | POA: Diagnosis not present
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
## 2023-03-18 NOTE — Patient Instructions (Signed)
[name] , Thank you for taking time to come for your Medicare Wellness Visit. I appreciate your ongoing commitment to your health goals. Please review the following plan we discussed and let me know if I can assist you in the future.   Referrals/Orders/Follow-Ups/Clinician Recommendations: Aim for 30 minutes of exercise or brisk walking, 6-8 glasses of water, and 5 servings of fruits and vegetables each day.   This is a list of the screening recommended for you and due dates:  Health Maintenance  Topic Date Due   Zoster (Shingles) Vaccine (1 of 2) Never done   Pneumonia Vaccine (1 of 1 - PCV) Never done   COVID-19 Vaccine (3 - Moderna risk series) 11/09/2019   Mammogram  08/07/2022   Flu Shot  02/19/2023   Hepatitis C Screening  04/01/2023*   Cologuard (Stool DNA test)  03/12/2024   Medicare Annual Wellness Visit  03/17/2024   DEXA scan (bone density measurement)  04/07/2025   DTaP/Tdap/Td vaccine (2 - Td or Tdap) 12/15/2032   HPV Vaccine  Aged Out  *Topic was postponed. The date shown is not the original due date.    Advanced directives: (Copy Requested) Please bring a copy of your health care power of attorney and living will to the office to be added to your chart at your convenience.  Next Medicare Annual Wellness Visit scheduled for next year: Yes  Insert Preventive Care attachment Insert FALL PREVENTION attachment if needed

## 2023-03-25 ENCOUNTER — Other Ambulatory Visit (HOSPITAL_COMMUNITY): Payer: Self-pay | Admitting: Nephrology

## 2023-03-25 DIAGNOSIS — N1831 Chronic kidney disease, stage 3a: Secondary | ICD-10-CM

## 2023-04-01 ENCOUNTER — Ambulatory Visit (HOSPITAL_COMMUNITY)
Admission: RE | Admit: 2023-04-01 | Discharge: 2023-04-01 | Disposition: A | Discharge status: Home / Self Care | Payer: No Typology Code available for payment source | Source: Ambulatory Visit | Attending: Nephrology | Admitting: Nephrology

## 2023-04-01 DIAGNOSIS — N1831 Chronic kidney disease, stage 3a: Secondary | ICD-10-CM | POA: Insufficient documentation

## 2023-04-01 DIAGNOSIS — N189 Chronic kidney disease, unspecified: Secondary | ICD-10-CM | POA: Diagnosis not present

## 2023-04-01 DIAGNOSIS — N1330 Unspecified hydronephrosis: Secondary | ICD-10-CM | POA: Diagnosis not present

## 2023-04-13 ENCOUNTER — Other Ambulatory Visit: Payer: Self-pay | Admitting: *Deleted

## 2023-04-13 DIAGNOSIS — E7800 Pure hypercholesterolemia, unspecified: Secondary | ICD-10-CM

## 2023-04-13 MED ORDER — ROSUVASTATIN CALCIUM 10 MG PO TABS
10.0000 mg | ORAL_TABLET | Freq: Every day | ORAL | 0 refills | Status: DC
Start: 2023-04-13 — End: 2023-05-12
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
## 2023-04-22 ENCOUNTER — Encounter: Payer: Self-pay | Admitting: Family Medicine

## 2023-04-22 ENCOUNTER — Other Ambulatory Visit: Payer: Self-pay | Admitting: Family Medicine

## 2023-04-22 ENCOUNTER — Ambulatory Visit: Payer: No Typology Code available for payment source | Admitting: Family Medicine

## 2023-04-22 VITALS — BP 118/74 | HR 80 | Ht 64.0 in | Wt 171.0 lb

## 2023-04-22 DIAGNOSIS — M9903 Segmental and somatic dysfunction of lumbar region: Secondary | ICD-10-CM | POA: Diagnosis not present

## 2023-04-22 DIAGNOSIS — M9902 Segmental and somatic dysfunction of thoracic region: Secondary | ICD-10-CM | POA: Diagnosis not present

## 2023-04-22 DIAGNOSIS — E039 Hypothyroidism, unspecified: Secondary | ICD-10-CM

## 2023-04-22 DIAGNOSIS — M9904 Segmental and somatic dysfunction of sacral region: Secondary | ICD-10-CM | POA: Diagnosis not present

## 2023-04-22 DIAGNOSIS — M9901 Segmental and somatic dysfunction of cervical region: Secondary | ICD-10-CM

## 2023-04-22 DIAGNOSIS — M9908 Segmental and somatic dysfunction of rib cage: Secondary | ICD-10-CM

## 2023-04-22 DIAGNOSIS — M4316 Spondylolisthesis, lumbar region: Secondary | ICD-10-CM | POA: Diagnosis not present

## 2023-04-22 DIAGNOSIS — F3342 Major depressive disorder, recurrent, in full remission: Secondary | ICD-10-CM

## 2023-04-22 MED ORDER — CITALOPRAM HYDROBROMIDE 10 MG PO TABS
10.0000 mg | ORAL_TABLET | Freq: Every day | ORAL | 0 refills | Status: DC
Start: 2023-04-22 — End: 2023-07-20

## 2023-04-22 MED ORDER — LEVOTHYROXINE SODIUM 25 MCG PO TABS
25.0000 ug | ORAL_TABLET | Freq: Every day | ORAL | 0 refills | Status: DC
Start: 2023-04-22 — End: 2023-05-12

## 2023-04-22 NOTE — Telephone Encounter (Signed)
I called pt & she said that she got labs done not too long ago, but not sure what was ordered. Pt has an appt on 05-12-23 for cpe w/Dr G. Can she get enough meds to get to her appt? Please call pt.

## 2023-04-22 NOTE — Addendum Note (Signed)
Addended by: Julious Payer D on: 04/22/2023 10:44 AM   Modules accepted: Orders

## 2023-04-22 NOTE — Assessment & Plan Note (Signed)
Continues to have some chronic discomfort noted.  Does have some also with some underlying hypermobility that things could potentially cause some discomfort and pain as well.  Discussed with patient that icing regimen and home exercises, discussed which activities to do and which ones to avoid.  Increase activity slowly otherwise.  Follow-up with me again in 6 to 8 weeks.

## 2023-04-22 NOTE — Telephone Encounter (Signed)
[facility] last TSH 03/31/22 NO RF sent to pharmacy since labs greater than a year

## 2023-04-22 NOTE — Patient Instructions (Signed)
See you again in 3 months 

## 2023-05-08 NOTE — Patient Instructions (Signed)
Our records indicate that you are due for your screening mammogram.  Your primary care provider has ordered your mammogram for the mobile unit through the [facility] that comes to our location. Please stop at check out or  call our office if you would like to make an appointment.

## 2023-05-12 ENCOUNTER — Encounter: Payer: Self-pay | Admitting: Family Medicine

## 2023-05-12 ENCOUNTER — Ambulatory Visit: Payer: No Typology Code available for payment source | Admitting: Family Medicine

## 2023-05-12 VITALS — BP 122/79 | HR 80 | Temp 98.6°F | Ht 64.0 in | Wt 173.0 lb

## 2023-05-12 DIAGNOSIS — Z1159 Encounter for screening for other viral diseases: Secondary | ICD-10-CM | POA: Diagnosis not present

## 2023-05-12 DIAGNOSIS — Z0001 Encounter for general adult medical examination with abnormal findings: Secondary | ICD-10-CM

## 2023-05-12 DIAGNOSIS — Z0000 Encounter for general adult medical examination without abnormal findings: Secondary | ICD-10-CM

## 2023-05-12 DIAGNOSIS — E039 Hypothyroidism, unspecified: Secondary | ICD-10-CM

## 2023-05-12 DIAGNOSIS — N289 Disorder of kidney and ureter, unspecified: Secondary | ICD-10-CM

## 2023-05-12 DIAGNOSIS — E7800 Pure hypercholesterolemia, unspecified: Secondary | ICD-10-CM | POA: Diagnosis not present

## 2023-05-12 DIAGNOSIS — Z23 Encounter for immunization: Secondary | ICD-10-CM

## 2023-05-12 MED ORDER — ROSUVASTATIN CALCIUM 10 MG PO TABS: 10.0000 mg | ORAL_TABLET | Freq: Every day | ORAL | 3 refills | Status: DC

## 2023-05-12 MED ORDER — LEVOTHYROXINE SODIUM 25 MCG PO TABS: 25.0000 ug | ORAL_TABLET | Freq: Every day | ORAL | 3 refills | Status: DC
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
## 2023-05-14 ENCOUNTER — Other Ambulatory Visit: Payer: No Typology Code available for payment source

## 2023-05-14 DIAGNOSIS — N189 Chronic kidney disease, unspecified: Secondary | ICD-10-CM | POA: Diagnosis not present

## 2023-05-14 DIAGNOSIS — N1831 Chronic kidney disease, stage 3a: Secondary | ICD-10-CM | POA: Diagnosis not present

## 2023-05-14 DIAGNOSIS — E559 Vitamin D deficiency, unspecified: Secondary | ICD-10-CM | POA: Diagnosis not present

## 2023-05-14 DIAGNOSIS — E039 Hypothyroidism, unspecified: Secondary | ICD-10-CM | POA: Diagnosis not present

## 2023-05-14 DIAGNOSIS — R319 Hematuria, unspecified: Secondary | ICD-10-CM | POA: Diagnosis not present

## 2023-05-21 ENCOUNTER — Telehealth: Payer: Self-pay | Admitting: Family Medicine

## 2023-05-21 DIAGNOSIS — E611 Iron deficiency: Secondary | ICD-10-CM

## 2023-05-21 DIAGNOSIS — R3121 Asymptomatic microscopic hematuria: Secondary | ICD-10-CM

## 2023-05-21 NOTE — Telephone Encounter (Signed)
REFERRAL REQUEST Telephone Note  Have you been seen at our office for this problem? Pt says that DR Wolfgang Phoenix advised pt to get referral for GI and Urology (Advise that they may need an appointment with their PCP before a referral can be done)  Reason for Referral: Pt says that DR Wolfgang Phoenix advised pt to get referral for GI and Urology Referral discussed with patient: with DR Susa Loffler contact number of patient for referral team: [phone]    Has patient been seen by a specialist for this issue before: no  Patient provider preference for referral: [place] Patient location preference for referral:    Patient notified that referrals can take up to a week or longer to process. If they haven't heard anything within a week they should call back and speak with the referral department.

## 2023-05-25 ENCOUNTER — Ambulatory Visit
Admission: RE | Admit: 2023-05-25 | Discharge: 2023-05-25 | Disposition: A | Discharge status: Home / Self Care | Payer: No Typology Code available for payment source | Source: Ambulatory Visit | Attending: Family Medicine

## 2023-05-25 DIAGNOSIS — Z1231 Encounter for screening mammogram for malignant neoplasm of breast: Secondary | ICD-10-CM | POA: Diagnosis not present
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
## 2023-05-26 ENCOUNTER — Telehealth: Payer: Self-pay | Admitting: Family Medicine

## 2023-05-27 NOTE — Telephone Encounter (Signed)
Dr. Loreta Ave was requested by Patient, I have rerouted Referral to [facility] at this time.

## 2023-06-10 ENCOUNTER — Encounter: Payer: Self-pay | Admitting: Nurse Practitioner

## 2023-06-10 ENCOUNTER — Ambulatory Visit (INDEPENDENT_AMBULATORY_CARE_PROVIDER_SITE_OTHER): Payer: No Typology Code available for payment source | Admitting: Nurse Practitioner

## 2023-06-10 VITALS — BP 120/67 | HR 62 | Temp 97.6°F | Ht 64.0 in | Wt 173.0 lb

## 2023-06-10 DIAGNOSIS — K5790 Diverticulosis of intestine, part unspecified, without perforation or abscess without bleeding: Secondary | ICD-10-CM

## 2023-06-10 MED ORDER — AMOXICILLIN-POT CLAVULANATE 875-125 MG PO TABS: 1.0000 | ORAL_TABLET | Freq: Two times a day (BID) | ORAL | 0 refills | Status: DC

## 2023-06-10 MED ORDER — DICYCLOMINE HCL 10 MG PO CAPS
10.0000 mg | ORAL_CAPSULE | Freq: Three times a day (TID) | ORAL | 0 refills | Status: DC
Start: 2023-06-10 — End: 2023-09-25
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
## 2023-06-11 DIAGNOSIS — N130 Hydronephrosis with ureteropelvic junction obstruction: Secondary | ICD-10-CM | POA: Diagnosis not present

## 2023-06-11 DIAGNOSIS — R3121 Asymptomatic microscopic hematuria: Secondary | ICD-10-CM | POA: Diagnosis not present

## 2023-06-23 ENCOUNTER — Ambulatory Visit (AMBULATORY_SURGERY_CENTER): Payer: No Typology Code available for payment source

## 2023-06-23 VITALS — Ht 64.0 in | Wt 173.0 lb

## 2023-06-23 DIAGNOSIS — Z860100 Personal history of colon polyps, unspecified: Secondary | ICD-10-CM

## 2023-06-23 MED ORDER — NA SULFATE-K SULFATE-MG SULF 17.5-3.13-1.6 GM/177ML PO SOLN: 1.0000 | Freq: Once | ORAL | 0 refills | Status: AC

## 2023-06-23 NOTE — Progress Notes (Signed)

## 2023-06-26 ENCOUNTER — Encounter: Payer: No Typology Code available for payment source | Admitting: Gastroenterology

## 2023-06-30 ENCOUNTER — Ambulatory Visit (AMBULATORY_SURGERY_CENTER): Payer: No Typology Code available for payment source | Admitting: Gastroenterology

## 2023-06-30 ENCOUNTER — Encounter: Payer: Self-pay | Admitting: Gastroenterology

## 2023-06-30 VITALS — BP 148/75 | HR 64 | Temp 97.5°F | Resp 15 | Ht 64.0 in | Wt 173.0 lb

## 2023-06-30 DIAGNOSIS — K5730 Diverticulosis of large intestine without perforation or abscess without bleeding: Secondary | ICD-10-CM | POA: Diagnosis not present

## 2023-06-30 DIAGNOSIS — K644 Residual hemorrhoidal skin tags: Secondary | ICD-10-CM | POA: Diagnosis not present

## 2023-06-30 DIAGNOSIS — F32A Depression, unspecified: Secondary | ICD-10-CM | POA: Diagnosis not present

## 2023-06-30 DIAGNOSIS — K648 Other hemorrhoids: Secondary | ICD-10-CM

## 2023-06-30 DIAGNOSIS — E611 Iron deficiency: Secondary | ICD-10-CM

## 2023-06-30 DIAGNOSIS — D508 Other iron deficiency anemias: Secondary | ICD-10-CM | POA: Diagnosis not present

## 2023-06-30 DIAGNOSIS — Z1211 Encounter for screening for malignant neoplasm of colon: Secondary | ICD-10-CM

## 2023-06-30 DIAGNOSIS — N1831 Chronic kidney disease, stage 3a: Secondary | ICD-10-CM | POA: Diagnosis not present

## 2023-06-30 MED ORDER — SODIUM CHLORIDE 0.9 % IV SOLN: 500.0000 mL | INTRAVENOUS | Status: DC
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
## 2023-06-30 NOTE — Patient Instructions (Signed)
No repeat colonoscopy due to age.  EGD is scheduled for 07/17/23 @ 10:00 am, please arrive at 9:00 am.  YOU HAD AN ENDOSCOPIC PROCEDURE TODAY AT THE [facility]:   Refer to the procedure report that was given to you for any specific questions about what was found during the examination.  If the procedure report does not answer your questions, please call your gastroenterologist to clarify.  If you requested that your care partner not be given the details of your procedure findings, then the procedure report has been included in a sealed envelope for you to review at your convenience later.  YOU SHOULD EXPECT: Some feelings of bloating in the abdomen. Passage of more gas than usual.  Walking can help get rid of the air that was put into your GI tract during the procedure and reduce the bloating. If you had a lower endoscopy (such as a colonoscopy or flexible sigmoidoscopy) you may notice spotting of blood in your stool or on the toilet paper. If you underwent a bowel prep for your procedure, you may not have a normal bowel movement for a few days.  Please Note:  You might notice some irritation and congestion in your nose or some drainage.  This is from the oxygen used during your procedure.  There is no need for concern and it should clear up in a day or so.  SYMPTOMS TO REPORT IMMEDIATELY:  Following lower endoscopy (colonoscopy or flexible sigmoidoscopy):  Excessive amounts of blood in the stool  Significant tenderness or worsening of abdominal pains  Swelling of the abdomen that is new, acute  Fever of 100F or higher    For urgent or emergent issues, a gastroenterologist can be reached at any hour by calling [phone]. Do not use MyChart messaging for urgent concerns.    DIET:  We do recommend a small meal at first, but then you may proceed to your regular diet.  Drink plenty of fluids but you should avoid alcoholic beverages for 24 hours.  ACTIVITY:  You should plan  to take it easy for the rest of today and you should NOT DRIVE or use heavy machinery until tomorrow (because of the sedation medicines used during the test).    FOLLOW UP: Our staff will call the number listed on your records the next business day following your procedure.  We will call around 7:15- 8:00 am to check on you and address any questions or concerns that you may have regarding the information given to you following your procedure. If we do not reach you, we will leave a message.     If any biopsies were taken you will be contacted by phone or by letter within the next 1-3 weeks.  Please call us at [phone] if you have not heard about the biopsies in 3 weeks.    SIGNATURES/CONFIDENTIALITY: You and/or your care partner have signed paperwork which will be entered into your electronic medical record.  These signatures attest to the fact that that the information above on your After Visit Summary has been reviewed and is understood.  Full responsibility of the confidentiality of this discharge information lies with you and/or your care-partner.

## 2023-06-30 NOTE — Progress Notes (Unsigned)
Patient states there have been no changes to medical or surgical history since time of pre-visit. 

## 2023-06-30 NOTE — Op Note (Signed)
[facility] Patient Name: Kimberly [name] Procedure Date: 06/30/2023 1:24 PM MRN: [id number] Endoscopist: Napoleon Form , MD, [id number] Age: 72 Referring MD:  Date of Birth: March 05, 1951 Gender: Female Account #: [id number] Procedure:                Colonoscopy Indications:              Unexplained iron deficiency anemia Medicines:                Monitored Anesthesia Care Procedure:                Pre-Anesthesia Assessment:                           - Prior to the procedure, a History and Physical                            was performed, and patient medications and                            allergies were reviewed. The patient's tolerance of                            previous anesthesia was also reviewed. The risks                            and benefits of the procedure and the sedation                            options and risks were discussed with the patient.                            All questions were answered, and informed consent                            was obtained. Prior Anticoagulants: The patient has                            taken no anticoagulant or antiplatelet agents. ASA                            Grade Assessment: II - A patient with mild systemic                            disease. After reviewing the risks and benefits,                            the patient was deemed in satisfactory condition to                            undergo the procedure.                           After obtaining informed consent, the colonoscope  was passed under direct vision. Throughout the                            procedure, the patient's blood pressure, pulse, and                            oxygen saturations were monitored continuously. The                            Olympus Scope [id number] was introduced through the                            anus and advanced to the the cecum, identified by                            appendiceal  orifice and ileocecal valve. The                            colonoscopy was performed without difficulty. The                            patient tolerated the procedure well. The quality                            of the bowel preparation was good. The ileocecal                            valve, appendiceal orifice, and rectum were                            photographed. Scope In: 1:35:14 PM Scope Out: 1:54:06 PM Scope Withdrawal Time: 0 hours 14 minutes 27 seconds  Total Procedure Duration: 0 hours 18 minutes 52 seconds  Findings:                 The perianal and digital rectal examinations were                            normal.                           Scattered small-mouthed diverticula were found in                            the sigmoid colon and descending colon.                           Non-bleeding external and internal hemorrhoids were                            found during retroflexion. The hemorrhoids were                            medium-sized. Complications:            No immediate complications. Estimated Blood Loss:  Estimated blood loss was minimal. Impression:               - Diverticulosis in the sigmoid colon and in the                            descending colon.                           - Non-bleeding external and internal hemorrhoids.                           - No specimens collected. Recommendation:           - Patient has a contact number available for                            emergencies. The signs and symptoms of potential                            delayed complications were discussed with the                            patient. Return to normal activities tomorrow.                            Written discharge instructions were provided to the                            patient.                           - Resume previous diet.                           - Continue present medications.                           - No repeat colonoscopy due to  age.                           - Schedule EGD for further evaluation of iron                            deficiency next available appointment, if                            unremarkable will need small bowel capsule endosocpy Napoleon Form, MD 06/30/2023 2:01:20 PM This report has been signed electronically.

## 2023-06-30 NOTE — Progress Notes (Signed)
Sedate, gd SR, tolerated procedure well, VSS, report to RN 

## 2023-07-01 ENCOUNTER — Telehealth: Payer: Self-pay

## 2023-07-01 ENCOUNTER — Ambulatory Visit: Admitting: Family Medicine

## 2023-07-01 NOTE — Telephone Encounter (Signed)
  Follow up Call-     06/30/2023   12:53 PM  Call back number  Post procedure Call Back phone  # [phone]  Permission to leave phone message Yes     Patient questions:  Do you have a fever, pain , or abdominal swelling? No. Pain Score  0 *  Have you tolerated food without any problems? Yes.    Have you been able to return to your normal activities? Yes.    Do you have any questions about your discharge instructions: Diet   No. Medications  No. Follow up visit  No.  Do you have questions or concerns about your Care? No.  Actions: * If pain score is 4 or above: No action needed, pain <4.

## 2023-07-07 DIAGNOSIS — K760 Fatty (change of) liver, not elsewhere classified: Secondary | ICD-10-CM | POA: Diagnosis not present

## 2023-07-07 DIAGNOSIS — Z0100 Encounter for examination of eyes and vision without abnormal findings: Secondary | ICD-10-CM | POA: Diagnosis not present

## 2023-07-07 DIAGNOSIS — R3121 Asymptomatic microscopic hematuria: Secondary | ICD-10-CM | POA: Diagnosis not present

## 2023-07-07 DIAGNOSIS — H52223 Regular astigmatism, bilateral: Secondary | ICD-10-CM | POA: Diagnosis not present

## 2023-07-07 DIAGNOSIS — N1330 Unspecified hydronephrosis: Secondary | ICD-10-CM | POA: Diagnosis not present

## 2023-07-07 DIAGNOSIS — R3129 Other microscopic hematuria: Secondary | ICD-10-CM | POA: Diagnosis not present
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
## 2023-07-08 ENCOUNTER — Encounter: Payer: Self-pay | Admitting: Family Medicine

## 2023-07-08 ENCOUNTER — Ambulatory Visit: Payer: No Typology Code available for payment source | Admitting: Family Medicine

## 2023-07-08 VITALS — BP 126/76 | HR 65 | Ht 64.0 in | Wt 174.0 lb

## 2023-07-08 DIAGNOSIS — N1831 Chronic kidney disease, stage 3a: Secondary | ICD-10-CM

## 2023-07-08 DIAGNOSIS — M4316 Spondylolisthesis, lumbar region: Secondary | ICD-10-CM | POA: Diagnosis not present

## 2023-07-08 DIAGNOSIS — M9902 Segmental and somatic dysfunction of thoracic region: Secondary | ICD-10-CM | POA: Diagnosis not present

## 2023-07-08 DIAGNOSIS — M9901 Segmental and somatic dysfunction of cervical region: Secondary | ICD-10-CM | POA: Diagnosis not present

## 2023-07-08 DIAGNOSIS — M9908 Segmental and somatic dysfunction of rib cage: Secondary | ICD-10-CM

## 2023-07-08 DIAGNOSIS — M9903 Segmental and somatic dysfunction of lumbar region: Secondary | ICD-10-CM

## 2023-07-08 DIAGNOSIS — M9904 Segmental and somatic dysfunction of sacral region: Secondary | ICD-10-CM

## 2023-07-08 NOTE — Assessment & Plan Note (Signed)
Arthritic changes noted.  Discussed icing regimen and home exercises, discussed which activities to do and which ones to avoid.  Will continue to monitor.  Patient is being worked up for the hematuria in the hydronephrosis that was noted previously.  Depending on findings and workup for hematuria this may change medical management.  Follow-up with me again in 6 to 8 weeks otherwise.

## 2023-07-08 NOTE — Patient Instructions (Signed)
Good to see you! Think the water aerobics is doing wonderful See you again in 3 months

## 2023-07-12 ENCOUNTER — Encounter: Payer: Self-pay | Admitting: Certified Registered Nurse Anesthetist

## 2023-07-17 ENCOUNTER — Ambulatory Visit: Payer: No Typology Code available for payment source | Admitting: Gastroenterology

## 2023-07-17 ENCOUNTER — Encounter: Payer: Self-pay | Admitting: Gastroenterology

## 2023-07-17 VITALS — BP 115/70 | HR 73 | Temp 97.5°F | Resp 19 | Ht 64.0 in | Wt 173.0 lb

## 2023-07-17 DIAGNOSIS — K2950 Unspecified chronic gastritis without bleeding: Secondary | ICD-10-CM | POA: Diagnosis not present

## 2023-07-17 DIAGNOSIS — F32A Depression, unspecified: Secondary | ICD-10-CM | POA: Diagnosis not present

## 2023-07-17 DIAGNOSIS — D508 Other iron deficiency anemias: Secondary | ICD-10-CM

## 2023-07-17 DIAGNOSIS — Z860100 Personal history of colon polyps, unspecified: Secondary | ICD-10-CM

## 2023-07-17 DIAGNOSIS — D509 Iron deficiency anemia, unspecified: Secondary | ICD-10-CM | POA: Diagnosis not present

## 2023-07-17 DIAGNOSIS — K2289 Other specified disease of esophagus: Secondary | ICD-10-CM | POA: Diagnosis not present

## 2023-07-17 MED ORDER — SODIUM CHLORIDE 0.9 % IV SOLN: 500.0000 mL | Freq: Once | INTRAVENOUS | Status: DC

## 2023-07-17 MED ORDER — PANTOPRAZOLE SODIUM 40 MG PO TBEC: 40.0000 mg | DELAYED_RELEASE_TABLET | Freq: Every day | ORAL | 3 refills | Status: DC
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
## 2023-07-17 NOTE — Progress Notes (Signed)
Report given to PACU, vss 

## 2023-07-17 NOTE — Progress Notes (Signed)
1013 Robinul 0.1 mg IV given due large amount of secretions upon assessment.  MD made aware, vss  ?

## 2023-07-17 NOTE — Patient Instructions (Addendum)
-   Resume previous diet. - Continue present medications. - Await pathology results. - Follow an antireflux regimen. - Use Protonix (pantoprazole) 40 mg PO daily. Rx for 90 days with 3 refills   YOU HAD AN ENDOSCOPIC PROCEDURE TODAY AT THE [facility]:   Refer to the procedure report that was given to you for any specific questions about what was found during the examination.  If the procedure report does not answer your questions, please call your gastroenterologist to clarify.  If you requested that your care partner not be given the details of your procedure findings, then the procedure report has been included in a sealed envelope for you to review at your convenience later.  YOU SHOULD EXPECT: Some feelings of bloating in the abdomen. Passage of more gas than usual.  Walking can help get rid of the air that was put into your GI tract during the procedure and reduce the bloating. If you had a lower endoscopy (such as a colonoscopy or flexible sigmoidoscopy) you may notice spotting of blood in your stool or on the toilet paper. If you underwent a bowel prep for your procedure, you may not have a normal bowel movement for a few days.  Please Note:  You might notice some irritation and congestion in your nose or some drainage.  This is from the oxygen used during your procedure.  There is no need for concern and it should clear up in a day or so.  SYMPTOMS TO REPORT IMMEDIATELY:  Following upper endoscopy (EGD)  Vomiting of blood or coffee ground material  New chest pain or pain under the shoulder blades  Painful or persistently difficult swallowing  New shortness of breath  Fever of 100F or higher  Black, tarry-looking stools  For urgent or emergent issues, a gastroenterologist can be reached at any hour by calling [phone]. Do not use MyChart messaging for urgent concerns.    DIET:  We do recommend a small meal at first, but then you may proceed to your regular diet.   Drink plenty of fluids but you should avoid alcoholic beverages for 24 hours.  ACTIVITY:  You should plan to take it easy for the rest of today and you should NOT DRIVE or use heavy machinery until tomorrow (because of the sedation medicines used during the test).    FOLLOW UP: Our staff will call the number listed on your records the next business day following your procedure.  We will call around 7:15- 8:00 am to check on you and address any questions or concerns that you may have regarding the information given to you following your procedure. If we do not reach you, we will leave a message.     If any biopsies were taken you will be contacted by phone or by letter within the next 1-3 weeks.  Please call us at [phone] if you have not heard about the biopsies in 3 weeks.    SIGNATURES/CONFIDENTIALITY: You and/or your care partner have signed paperwork which will be entered into your electronic medical record.  These signatures attest to the fact that that the information above on your After Visit Summary has been reviewed and is understood.  Full responsibility of the confidentiality of this discharge information lies with you and/or your care-partner.

## 2023-07-17 NOTE — Op Note (Signed)
[facility] Patient Name: Kimberly [name] Procedure Date: 07/17/2023 9:57 AM MRN: [id number] Endoscopist: Napoleon Form , MD, [id number] Age: 72 Referring MD:  Date of Birth: 1951/01/09 Gender: Female Account #: [id number] Procedure:                Upper GI endoscopy Indications:              Iron deficiency anemia with no gastrointestinal                            bleeding source identified during previous                            colonoscopy Medicines:                Monitored Anesthesia Care Procedure:                Pre-Anesthesia Assessment:                           - Prior to the procedure, a History and Physical                            was performed, and patient medications and                            allergies were reviewed. The patient's tolerance of                            previous anesthesia was also reviewed. The risks                            and benefits of the procedure and the sedation                            options and risks were discussed with the patient.                            All questions were answered, and informed consent                            was obtained. Prior Anticoagulants: The patient has                            taken no anticoagulant or antiplatelet agents. ASA                            Grade Assessment: II - A patient with mild systemic                            disease. After reviewing the risks and benefits,                            the patient was deemed in satisfactory condition to  undergo the procedure.                           After obtaining informed consent, the endoscope was                            passed under direct vision. Throughout the                            procedure, the patient's blood pressure, pulse, and                            oxygen saturations were monitored continuously. The                            Olympus Scope [id number] was introduced through  the                            mouth, and advanced to the second part of duodenum.                            The upper GI endoscopy was accomplished without                            difficulty. The patient tolerated the procedure                            well. Scope In: Scope Out: Findings:                 There were esophageal mucosal changes suspicious                            for short-segment Barrett's esophagus present in                            the distal esophagus from 33 to 35 cm from                            incissors. The maximum longitudinal extent of these                            mucosal changes was 2 cm in length. This was                            biopsied with a cold forceps for histology.                           The gastroesophageal flap valve was visualized                            endoscopically and classified as Hill Grade II                            (fold present, opens with respiration).  The stomach was normal.                           The cardia and gastric fundus were normal on                            retroflexion.                           The examined duodenum was normal. Complications:            No immediate complications. Estimated Blood Loss:     Estimated blood loss was minimal. Impression:               - Esophageal mucosal changes suspicious for                            short-segment Barrett's esophagus. Biopsied.                           - Gastroesophageal flap valve classified as Hill                            Grade II (fold present, opens with respiration).                           - Normal stomach.                           - Normal examined duodenum. Recommendation:           - Patient has a contact number available for                            emergencies. The signs and symptoms of potential                            delayed complications were discussed with the                             patient. Return to normal activities tomorrow.                            Written discharge instructions were provided to the                            patient.                           - Resume previous diet.                           - Continue present medications.                           - Await pathology results.                           -  Follow an antireflux regimen.                           - Use Protonix (pantoprazole) 40 mg PO daily. Rx                            fir 90 days with 3 refills Napoleon Form, MD 07/17/2023 10:31:05 AM This report has been signed electronically.

## 2023-07-17 NOTE — Progress Notes (Signed)
Called to room to assist during endoscopic procedure.  Patient ID and intended procedure confirmed with present staff. Received instructions for my participation in the procedure from the performing physician.  

## 2023-07-17 NOTE — Progress Notes (Signed)
 Vitals-Autumn  Pt's states no medical or surgical changes since previsit or office visit.

## 2023-07-19 ENCOUNTER — Other Ambulatory Visit: Payer: Self-pay | Admitting: Family Medicine

## 2023-07-19 DIAGNOSIS — F3342 Major depressive disorder, recurrent, in full remission: Secondary | ICD-10-CM

## 2023-07-20 ENCOUNTER — Telehealth: Payer: Self-pay

## 2023-07-20 ENCOUNTER — Encounter: Payer: No Typology Code available for payment source | Admitting: Gastroenterology

## 2023-07-20 NOTE — Telephone Encounter (Signed)
  Follow up Call-     07/17/2023    9:15 AM 06/30/2023   12:53 PM  Call back number  Post procedure Call Back phone  # [phone]  Permission to leave phone message Yes Yes     Patient questions:  Do you have a fever, pain , or abdominal swelling? No. Pain Score  0 *  Have you tolerated food without any problems? Yes.    Have you been able to return to your normal activities? Yes.    Do you have any questions about your discharge instructions: Diet   No. Medications  No. Follow up visit  No.  Do you have questions or concerns about your Care? No.  Actions: * If pain score is 4 or above: No action needed, pain <4.

## 2023-07-21 ENCOUNTER — Telehealth: Payer: Self-pay | Admitting: *Deleted

## 2023-07-21 NOTE — Telephone Encounter (Signed)
 Marland Kitchen

## 2023-07-22 DIAGNOSIS — R809 Proteinuria, unspecified: Secondary | ICD-10-CM | POA: Diagnosis not present

## 2023-07-22 DIAGNOSIS — N1831 Chronic kidney disease, stage 3a: Secondary | ICD-10-CM | POA: Diagnosis not present

## 2023-07-22 DIAGNOSIS — N1330 Unspecified hydronephrosis: Secondary | ICD-10-CM | POA: Diagnosis not present

## 2023-07-22 DIAGNOSIS — E559 Vitamin D deficiency, unspecified: Secondary | ICD-10-CM | POA: Diagnosis not present

## 2023-07-23 LAB — SURGICAL PATHOLOGY

## 2023-07-30 ENCOUNTER — Other Ambulatory Visit: Payer: PPO

## 2023-07-30 DIAGNOSIS — D631 Anemia in chronic kidney disease: Secondary | ICD-10-CM | POA: Diagnosis not present

## 2023-07-30 DIAGNOSIS — N189 Chronic kidney disease, unspecified: Secondary | ICD-10-CM | POA: Diagnosis not present

## 2023-07-30 DIAGNOSIS — R809 Proteinuria, unspecified: Secondary | ICD-10-CM | POA: Diagnosis not present

## 2023-07-30 DIAGNOSIS — D649 Anemia, unspecified: Secondary | ICD-10-CM | POA: Diagnosis not present

## 2023-08-01 DIAGNOSIS — Z8719 Personal history of other diseases of the digestive system: Secondary | ICD-10-CM | POA: Diagnosis not present

## 2023-08-01 DIAGNOSIS — R1030 Lower abdominal pain, unspecified: Secondary | ICD-10-CM | POA: Diagnosis not present

## 2023-08-04 DIAGNOSIS — R3121 Asymptomatic microscopic hematuria: Secondary | ICD-10-CM | POA: Diagnosis not present

## 2023-08-04 DIAGNOSIS — N130 Hydronephrosis with ureteropelvic junction obstruction: Secondary | ICD-10-CM | POA: Diagnosis not present

## 2023-08-05 ENCOUNTER — Other Ambulatory Visit (HOSPITAL_COMMUNITY): Payer: Self-pay | Admitting: Urology

## 2023-08-05 DIAGNOSIS — N1330 Unspecified hydronephrosis: Secondary | ICD-10-CM

## 2023-08-07 ENCOUNTER — Encounter (HOSPITAL_COMMUNITY)
Admission: RE | Admit: 2023-08-07 | Discharge: 2023-08-07 | Disposition: A | Discharge status: Home / Self Care | Payer: PPO | Source: Ambulatory Visit | Attending: Urology

## 2023-08-07 DIAGNOSIS — N1330 Unspecified hydronephrosis: Secondary | ICD-10-CM | POA: Insufficient documentation

## 2023-08-07 DIAGNOSIS — N2889 Other specified disorders of kidney and ureter: Secondary | ICD-10-CM | POA: Diagnosis not present

## 2023-08-07 MED ORDER — FUROSEMIDE 10 MG/ML IJ SOLN
INTRAMUSCULAR | Status: AC
  Filled 2023-08-07: qty 4

## 2023-08-07 MED ORDER — FUROSEMIDE 10 MG/ML IJ SOLN
40.0000 mg | Freq: Once | INTRAMUSCULAR | Status: AC
  Administered 2023-08-07: 39.25 mg via INTRAVENOUS

## 2023-08-07 MED ORDER — TECHNETIUM TC 99M MERTIATIDE
5.2700 | Freq: Once | INTRAVENOUS | Status: AC | PRN
  Administered 2023-08-07: 5.27 via INTRAVENOUS

## 2023-08-18 ENCOUNTER — Encounter: Payer: Self-pay | Admitting: Gastroenterology

## 2023-08-27 DIAGNOSIS — R319 Hematuria, unspecified: Secondary | ICD-10-CM | POA: Diagnosis not present

## 2023-08-27 DIAGNOSIS — R809 Proteinuria, unspecified: Secondary | ICD-10-CM | POA: Diagnosis not present

## 2023-08-27 DIAGNOSIS — N1831 Chronic kidney disease, stage 3a: Secondary | ICD-10-CM | POA: Diagnosis not present

## 2023-08-27 DIAGNOSIS — E559 Vitamin D deficiency, unspecified: Secondary | ICD-10-CM | POA: Diagnosis not present

## 2023-09-08 DIAGNOSIS — R3121 Asymptomatic microscopic hematuria: Secondary | ICD-10-CM | POA: Diagnosis not present

## 2023-09-08 DIAGNOSIS — N130 Hydronephrosis with ureteropelvic junction obstruction: Secondary | ICD-10-CM | POA: Diagnosis not present

## 2023-09-16 ENCOUNTER — Other Ambulatory Visit: Payer: Self-pay | Admitting: Urology

## 2023-09-29 NOTE — Progress Notes (Signed)
 Anesthesia Review:  PCP: [facility] LOV 11/20./24 Cardiologist :  PPM/ ICD: Device Orders: Rep Notified:  Chest x-ray : EKG : Echo : Stress test: Cardiac Cath :   Activity level:  Sleep Study/ CPAP : Fasting Blood Sugar :      / Checks Blood Sugar -- times a day:    Blood Thinner/ Instructions /Last Dose: ASA / Instructions/ Last Dose :

## 2023-09-29 NOTE — Patient Instructions (Signed)
 SURGICAL WAITING ROOM VISITATION  Patients having surgery or a procedure may have no more than 2 support people in the waiting area - these visitors may rotate.    Children under the age of 83 must have an adult with them who is not the patient.  Due to an increase in RSV and influenza rates and associated hospitalizations, children ages 27 and under may not visit patients in [facility] hospitals.  Visitors with respiratory illnesses are discouraged from visiting and should remain at home.  If the patient needs to stay at the hospital during part of their recovery, the visitor guidelines for inpatient rooms apply. Pre-op nurse will coordinate an appropriate time for 1 support person to accompany patient in pre-op.  This support person may not rotate.    Please refer to the [facility] website for the visitor guidelines for Inpatients (after your surgery is over and you are in a regular room).       Your procedure is scheduled on:  10/09/2023    Report to [facility] Main Entrance    Report to admitting at   1015AM   Call this number if you have problems the morning of surgery [phone]   Do not eat food  or drink liquids :After Midnight.                         If you have questions, please contact your surgeon's office.      Oral Hygiene is also important to reduce your risk of infection.                                    Remember - BRUSH YOUR TEETH THE MORNING OF SURGERY WITH YOUR REGULAR TOOTHPASTE  DENTURES WILL BE REMOVED PRIOR TO SURGERY PLEASE DO NOT APPLY "Poly grip" OR ADHESIVES!!!   Do NOT smoke after Midnight   Stop all vitamins and herbal supplements 7 days before surgery.   Take these medicines the morning of surgery with A SIP OF WATER:   DO NOT TAKE ANY ORAL DIABETIC MEDICATIONS DAY OF YOUR SURGERY  Bring CPAP mask and tubing day of surgery.                              You may not have any metal on your body including hair pins, jewelry,  and body piercing             Do not wear make-up, lotions, powders, perfumes/cologne, or deodorant  Do not wear nail polish including gel and S&S, artificial/acrylic nails, or any other type of covering on natural nails including finger and toenails. If you have artificial nails, gel coating, etc. that needs to be removed by a nail salon please have this removed prior to surgery or surgery may need to be canceled/ delayed if the surgeon/ anesthesia feels like they are unable to be safely monitored.   Do not shave  48 hours prior to surgery.               Men may shave face and neck.   Do not bring valuables to the hospital. [facility] IS NOT             RESPONSIBLE   FOR VALUABLES.   Contacts, glasses, dentures or bridgework may not be worn into surgery.  Bring small overnight bag day of surgery.   DO NOT BRING YOUR HOME MEDICATIONS TO THE HOSPITAL. PHARMACY WILL DISPENSE MEDICATIONS LISTED ON YOUR MEDICATION LIST TO YOU DURING YOUR ADMISSION IN THE HOSPITAL!    Patients discharged on the day of surgery will not be allowed to drive home.  Someone NEEDS to stay with you for the first 24 hours after anesthesia.   Special Instructions: Bring a copy of your healthcare power of attorney and living will documents the day of surgery if you haven't scanned them before.              Please read over the following fact sheets you were given: IF YOU HAVE QUESTIONS ABOUT YOUR PRE-OP INSTRUCTIONS PLEASE CALL [phone]   If you received a COVID test during your pre-op visit  it is requested that you wear a mask when out in public, stay away from anyone that may not be feeling well and notify your surgeon if you develop symptoms. If you test positive for Covid or have been in contact with anyone that has tested positive in the last 10 days please notify you surgeon.    [facility] - Preparing for Surgery Before surgery, you can play an important role.  Because skin is not sterile, your skin  needs to be as free of germs as possible.  You can reduce the number of germs on your skin by washing with CHG (chlorahexidine gluconate) soap before surgery.  CHG is an antiseptic cleaner which kills germs and bonds with the skin to continue killing germs even after washing. Please DO NOT use if you have an allergy to CHG or antibacterial soaps.  If your skin becomes reddened/irritated stop using the CHG and inform your nurse when you arrive at Short Stay. Do not shave (including legs and underarms) for at least 48 hours prior to the first CHG shower.  You may shave your face/neck. Please follow these instructions carefully:  1.  Shower with CHG Soap the night before surgery and the  morning of Surgery.  2.  If you choose to wash your hair, wash your hair first as usual with your  normal  shampoo.  3.  After you shampoo, rinse your hair and body thoroughly to remove the  shampoo.                           4.  Use CHG as you would any other liquid soap.  You can apply chg directly  to the skin and wash                       Gently with a scrungie or clean washcloth.  5.  Apply the CHG Soap to your body ONLY FROM THE NECK DOWN.   Do not use on face/ open                           Wound or open sores. Avoid contact with eyes, ears mouth and genitals (private parts).                       Wash face,  Genitals (private parts) with your normal soap.             6.  Wash thoroughly, paying special attention to the area where your surgery  will be performed.  7.  Thoroughly rinse your body  with warm water from the neck down.  8.  DO NOT shower/wash with your normal soap after using and rinsing off  the CHG Soap.                9.  Pat yourself dry with a clean towel.            10.  Wear clean pajamas.            11.  Place clean sheets on your bed the night of your first shower and do not  sleep with pets. Day of Surgery : Do not apply any lotions/deodorants the morning of surgery.  Please wear clean  clothes to the hospital/surgery center.  FAILURE TO FOLLOW THESE INSTRUCTIONS MAY RESULT IN THE CANCELLATION OF YOUR SURGERY PATIENT SIGNATURE_________________________________  NURSE SIGNATURE__________________________________  ________________________________________________________________________

## 2023-10-01 ENCOUNTER — Encounter (HOSPITAL_COMMUNITY)
Admission: RE | Admit: 2023-10-01 | Discharge: 2023-10-01 | Disposition: A | Discharge status: Home / Self Care | Payer: HMO | Source: Ambulatory Visit | Attending: Urology | Admitting: Urology

## 2023-10-01 ENCOUNTER — Encounter (HOSPITAL_COMMUNITY): Payer: Self-pay

## 2023-10-01 ENCOUNTER — Other Ambulatory Visit: Payer: Self-pay

## 2023-10-01 VITALS — BP 131/75 | HR 67 | Temp 97.7°F | Resp 16 | Ht 64.0 in | Wt 170.0 lb

## 2023-10-01 DIAGNOSIS — Z01818 Encounter for other preprocedural examination: Secondary | ICD-10-CM | POA: Insufficient documentation

## 2023-10-01 DIAGNOSIS — R001 Bradycardia, unspecified: Secondary | ICD-10-CM | POA: Diagnosis not present

## 2023-10-01 HISTORY — DX: Hypothyroidism, unspecified: E03.9

## 2023-10-01 HISTORY — DX: Unspecified osteoarthritis, unspecified site: M19.90

## 2023-10-01 HISTORY — DX: Dyspnea, unspecified: R06.00

## 2023-10-01 HISTORY — DX: Anemia, unspecified: D64.9

## 2023-10-01 LAB — BASIC METABOLIC PANEL
Anion gap: 8 (ref 5–15)
BUN: 15 mg/dL (ref 8–23)
CO2: 23 mmol/L (ref 22–32)
Calcium: 8.9 mg/dL (ref 8.9–10.3)
Chloride: 109 mmol/L (ref 98–111)
Creatinine, Ser: 1.02 mg/dL — ABNORMAL HIGH (ref 0.44–1.00)
GFR, Estimated: 58 mL/min — ABNORMAL LOW (ref >60)
Glucose, Bld: 93 mg/dL (ref 70–99)
Potassium: 4.1 mmol/L (ref 3.5–5.1)
Sodium: 140 mmol/L (ref 135–145)

## 2023-10-01 LAB — CBC
HCT: 38.7 % (ref 36.0–46.0)
Hemoglobin: 12.4 g/dL (ref 12.0–15.0)
MCH: 30.6 pg (ref 26.0–34.0)
MCHC: 32 g/dL (ref 30.0–36.0)
MCV: 95.6 fL (ref 80.0–100.0)
Platelets: 201 10*3/uL (ref 150–400)
RBC: 4.05 MIL/uL (ref 3.87–5.11)
RDW: 13.2 % (ref 11.5–15.5)
WBC: 4.7 10*3/uL (ref 4.0–10.5)
nRBC: 0 % (ref 0.0–0.2)
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
## 2023-10-07 ENCOUNTER — Ambulatory Visit (INDEPENDENT_AMBULATORY_CARE_PROVIDER_SITE_OTHER): Payer: No Typology Code available for payment source | Admitting: Family Medicine

## 2023-10-07 ENCOUNTER — Encounter: Payer: Self-pay | Admitting: Family Medicine

## 2023-10-07 VITALS — BP 118/88 | HR 76 | Ht 64.0 in

## 2023-10-07 DIAGNOSIS — M9904 Segmental and somatic dysfunction of sacral region: Secondary | ICD-10-CM

## 2023-10-07 DIAGNOSIS — M4316 Spondylolisthesis, lumbar region: Secondary | ICD-10-CM

## 2023-10-07 DIAGNOSIS — M9903 Segmental and somatic dysfunction of lumbar region: Secondary | ICD-10-CM

## 2023-10-07 DIAGNOSIS — M9902 Segmental and somatic dysfunction of thoracic region: Secondary | ICD-10-CM

## 2023-10-07 DIAGNOSIS — M9908 Segmental and somatic dysfunction of rib cage: Secondary | ICD-10-CM

## 2023-10-07 DIAGNOSIS — M9901 Segmental and somatic dysfunction of cervical region: Secondary | ICD-10-CM | POA: Diagnosis not present

## 2023-10-07 NOTE — Assessment & Plan Note (Signed)
 Patient back does have some loss lordosis noted.  Does have a kidney abnormality and is having surgical intervention in the near future.  Hopeful that this will make a significant difference for her.  Discussed which activities to do and which ones to avoid.  Increase activity slowly.  Discussed icing regimen.  Follow-up again in 6 to 8 weeks otherwise.

## 2023-10-07 NOTE — Patient Instructions (Signed)
 I'll send message to your urologist You are in good hands See you again 7-8 weeks after surgery

## 2023-10-08 NOTE — Op Note (Signed)
 Operative Note  Preoperative diagnosis:  1.  Right ureteropelvic junction obstruction.  Postoperative diagnosis: 1.   Right ureteropelvic junction obstruction.  Procedure(s): 1.  Robotic assisted laparoscopic right dismembered pyeloplasty 2. Cystoscopy 3. Right retrograde pyelogram 4. Right ureteral stent placement  Surgeon: Jettie Pagan, MD  Assistants:  Harrie Foreman, PA  An assistant was required for this surgical procedure.  The duties of the assistant included but were not limited to suctioning, passing suture, camera manipulation, retraction.  This procedure would not be able to be performed without an Geophysicist/field seismologist.   Anesthesia:  General  Complications:  None  EBL:  25ml  Specimens: 1. None  Drains/Catheters: 1.  6 French by 26 cm right ureteral stent 2.  16 French Foley catheter  Intraoperative findings:   Cystoscopy demonstrated no suspicious bladder lesions, no trabeculation, no stones. Right retrograde pyelogram demonstrated acute transition at the right ureteropelvic junction with severe hydronephrosis consistent with UPJ obstruction. No evidence of crossing vessel, likely adynamic proximal ureteral segment.  This 7 was excised and sent to pathology. Water tight anastomosis without tension over a 6 [place] by 26 cm right ureteral stent. Excellent hemostasis  Indication:  Kimberly [name] is a 73 year old female with history of a right UPJ obstruction.  After thorough discussion including all relevant risk benefits alternatives, patient presents today for robotic assisted laparoscopic right dismembered pyeloplasty.  Description of procedure: Indications, alternatives, benefits, and risks were discussed with the patient and informed sent was obtained.  The patient was brought into the operating room table, positioned supine and secured with a safety strap.  All pressure points were carefully padded and pneumatic compression devices were placed in the lower extremities.   After administration of intravenous antibiotics and general endotracheal anesthesia, she was positioned in low lithotomy and cystoscopy was performed demonstrating no suspicious bladder lesions.  Right retrograde pyelograms performed by intubating right ureteral orifice with a 5 French opening catheter and this revealed findings consistent with right UPJ obstruction with severe hydronephrosis with acute obstruction at the ureteropelvic junction.  Next, 0.038 wire was passed to the renal pelvis.  Over this, a 6 [place] by 26 cm double-J ureteral stent was placed. 16 French Foley catheter was inserted to drain the bladder.  The patient was then repositioned in the left lateral decubitus position with the right side up at a 45 degree angle with the lower leg flexed at 90 degrees and the upper leg extended. A gel roll was then positioned to protect the brachial plexus and a multiple back rolls were placed to support the back.  Right upper extremity was then placed in the armrest.  Patient was then secured in place across the hips, chest, and legs with foam padding and cloth tape and the table flexed.  Patient was prepped and draped in standard sterile manner.  The radiographic images were in the room. A time was completed, verifying the correct patient, surgical procedure, site and positioning prior to beginning the procedure.  Modified open Hassan technique was used to gain access to the abdomen lateral to the umbilicus.  An 8 French port was then placed. Pneumoperitoneum was introduced abd insufflating with CO2 to pressure 15 mmHg.  The camera trocar was then placed along the right lateral edge of the rectus sheath slightly cephalad to the umbilicus and a 30 degree lens was inserted under direct visitation.  Abdominal cavity was examined for any signs of injury, adhesions and identification of anatomic landmarks.  The remainder of the robotic trochars were  placed along the lateral edge of the rectus sheath, which  included an 8 mm port below the costal margin and one 8 mm port called to the camera port.  A separate fourth arm 8 mm port was placed laterally.  A 12 mm assistant port was placed superior to the umbilicus.  The robot was then docked.  The parietal peritoneum was incised along the white line of Toldt and the colon was reflected medially, exposing George's fascia. The hepatic flexure was mobilized, allowing for retraction of the liver. The duodenum was kocherized and the inferior vena cava was visualized. The proximal ureter was identified and carefully dissected cephalad towards the renal pelvis while preserving periureteral tissue.  The retroperitoneal fascia overlying the renal vessels was carefully separated, exposing the underlying hilum. There was no crossing vessel identified.  The stenotic ureteropelvic junction segment was identified, and excised.  The ureter was widely spatulated for a distance of 2.5 cm laterally.  The renal pelvis was spatulated medially.  The tension-free anastomosis was performed starting posteriorly at the apex using interrupted 4-0 Vicryl suture.  The anterior ureteropelvic anastomosis was then completed in identical fashion using interrupted 4-0 Vicryl sutures. After completing anastomosis, there was no sign of leak.  The operative field was inspected for bleeding or injury.  The insufflation pressure was reduced, again confirming the absence of bleeding.  Pneumoperitoneum was reestablished and a surgical JP drain was placed to the fourth arm port into the perirenal space away from the repair and secured to the skin.  Overlying fat was reapproximated over the anastomosis using a running Vicryl suture.  The robot was then undocked and removed from the operative field.  The trochars were withdrawn under direct visualization.  The anterior fascia at the assistant port site was then carefully closed with an 0 Vicryl suture on a passer.  A total of 20 mL of Exparel was injected  subcutaneously at the port sites.  The skin incisions were closed with 4-0 Monocryl sutures.  At the end the procedure, all counts were correct.  The patient tolerated the procedure well and was taken to recovery in stable condition.  Plan: Patient will be admitted overnight for observation.  She will have her Foley catheter removed at 5 AM.  We will plan to evaluate drain output and remove her drain around 9 AM if there is no significant drain output at that time.  We will leave the right ureteral stent in place for about 6 weeks.  Matt R. Jeronica Stlouis MD [facility]  Pager: [phone]
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
## 2023-10-08 NOTE — Discharge Instructions (Signed)
   Activity:  You are encouraged to ambulate frequently (about every hour during waking hours) to help prevent blood clots from forming in your legs or lungs.  However, you should not engage in any heavy lifting (> 10-15 lbs), strenuous activity, or straining.   Diet: You should advance your diet as instructed by your physician.  It will be normal to have some bloating, nausea, and abdominal discomfort intermittently.   Prescriptions:  You will be provided a prescription for pain medication to take as needed.  If your pain is not severe enough to require the prescription pain medication, you may take extra strength Tylenol instead which will have less side effects.  You should also take a prescribed stool softener to avoid straining with bowel movements as the prescription pain medication may constipate you.   Incisions: You may remove your dressing bandages 48 hours after surgery if not removed in the hospital.  You will either have some small staples or special tissue glue at each of the incision sites. Once the bandages are removed (if present), the incisions may stay open to air.  You may start showering (but not soaking or bathing in water) the 2nd day after surgery and the incisions simply need to be patted dry after the shower.  No additional care is needed.   What to call us about: You should call the office [phone]) if you develop fever > 101 or develop persistent vomiting. Activity:  You are encouraged to ambulate frequently (about every hour during waking hours) to help prevent blood clots from forming in your legs or lungs.  However, you should not engage in any heavy lifting (> 10-15 lbs), strenuous activity, or straining.

## 2023-10-09 ENCOUNTER — Observation Stay (HOSPITAL_COMMUNITY)
Admission: RE | Admit: 2023-10-09 | Discharge: 2023-10-11 | Disposition: A | Discharge status: Home / Self Care | Payer: PPO | Source: Ambulatory Visit | Attending: Urology | Admitting: Urology

## 2023-10-09 ENCOUNTER — Ambulatory Visit (HOSPITAL_BASED_OUTPATIENT_CLINIC_OR_DEPARTMENT_OTHER): Payer: Self-pay | Admitting: Anesthesiology

## 2023-10-09 ENCOUNTER — Ambulatory Visit (HOSPITAL_COMMUNITY): Payer: Self-pay | Admitting: Physician Assistant

## 2023-10-09 ENCOUNTER — Other Ambulatory Visit: Payer: Self-pay

## 2023-10-09 ENCOUNTER — Ambulatory Visit (HOSPITAL_COMMUNITY)

## 2023-10-09 ENCOUNTER — Encounter (HOSPITAL_COMMUNITY): Payer: Self-pay | Admitting: Urology

## 2023-10-09 ENCOUNTER — Encounter (HOSPITAL_COMMUNITY)
Admission: RE | Disposition: A | Discharge status: Home / Self Care | Payer: Self-pay | Source: Ambulatory Visit | Attending: Urology

## 2023-10-09 DIAGNOSIS — N135 Crossing vessel and stricture of ureter without hydronephrosis: Principal | ICD-10-CM | POA: Diagnosis present

## 2023-10-09 DIAGNOSIS — E039 Hypothyroidism, unspecified: Secondary | ICD-10-CM

## 2023-10-09 DIAGNOSIS — Z85828 Personal history of other malignant neoplasm of skin: Secondary | ICD-10-CM | POA: Insufficient documentation

## 2023-10-09 DIAGNOSIS — N1831 Chronic kidney disease, stage 3a: Secondary | ICD-10-CM | POA: Diagnosis not present

## 2023-10-09 DIAGNOSIS — Z01818 Encounter for other preprocedural examination: Secondary | ICD-10-CM

## 2023-10-09 DIAGNOSIS — N130 Hydronephrosis with ureteropelvic junction obstruction: Secondary | ICD-10-CM

## 2023-10-09 DIAGNOSIS — R319 Hematuria, unspecified: Secondary | ICD-10-CM | POA: Diagnosis not present

## 2023-10-09 HISTORY — PX: ROBOT ASSISTED PYELOPLASTY: SHX5143

## 2023-10-09 HISTORY — PX: CYSTOSCOPY W/ URETERAL STENT PLACEMENT: SHX1429

## 2023-10-09 SURGERY — XI ROBOTIC ASSISTED PYELOPLASTY WITH STENT PLACEMENT
Anesthesia: General | Laterality: Right

## 2023-10-09 MED ORDER — BUPIVACAINE LIPOSOME 1.3 % IJ SUSP
INTRAMUSCULAR | Status: DC | PRN
  Administered 2023-10-09: 20 mL

## 2023-10-09 MED ORDER — ONDANSETRON HCL 4 MG/2ML IJ SOLN
INTRAMUSCULAR | Status: AC
  Filled 2023-10-09: qty 2

## 2023-10-09 MED ORDER — PANTOPRAZOLE SODIUM 40 MG PO TBEC
40.0000 mg | DELAYED_RELEASE_TABLET | Freq: Every day | ORAL | Status: DC
  Administered 2023-10-10 – 2023-10-11 (×2): 40 mg via ORAL
  Filled 2023-10-09 (×2): qty 1

## 2023-10-09 MED ORDER — OXYCODONE HCL 5 MG PO TABS
5.0000 mg | ORAL_TABLET | ORAL | Status: DC | PRN
  Administered 2023-10-10 – 2023-10-11 (×5): 5 mg via ORAL
  Filled 2023-10-09 (×5): qty 1

## 2023-10-09 MED ORDER — SODIUM CHLORIDE 0.9 % IR SOLN
Status: DC | PRN
  Administered 2023-10-09: 1000 mL

## 2023-10-09 MED ORDER — PROPOFOL 10 MG/ML IV BOLUS
INTRAVENOUS | Status: AC
  Filled 2023-10-09: qty 20

## 2023-10-09 MED ORDER — ROCURONIUM BROMIDE 10 MG/ML (PF) SYRINGE
PREFILLED_SYRINGE | INTRAVENOUS | Status: DC | PRN
  Administered 2023-10-09: 50 mg via INTRAVENOUS
  Administered 2023-10-09: 5 mg via INTRAVENOUS
  Administered 2023-10-09: 10 mg via INTRAVENOUS

## 2023-10-09 MED ORDER — HYDROMORPHONE HCL 1 MG/ML IJ SOLN
0.5000 mg | INTRAMUSCULAR | Status: DC | PRN
Start: 2023-10-09 — End: 2023-10-11
  Administered 2023-10-09 (×2): 1 mg via INTRAVENOUS
  Filled 2023-10-09 (×3): qty 1

## 2023-10-09 MED ORDER — LACTATED RINGERS IV SOLN: INTRAVENOUS | Status: DC

## 2023-10-09 MED ORDER — KETAMINE HCL 10 MG/ML IJ SOLN
INTRAMUSCULAR | Status: DC | PRN
Start: 2023-10-09 — End: 2023-10-09
  Administered 2023-10-09: 20 mg via INTRAVENOUS

## 2023-10-09 MED ORDER — IOHEXOL 300 MG/ML  SOLN
INTRAMUSCULAR | Status: DC | PRN
  Administered 2023-10-09: 50 mL

## 2023-10-09 MED ORDER — DIPHENHYDRAMINE HCL 12.5 MG/5ML PO ELIX: 12.5000 mg | ORAL_SOLUTION | Freq: Four times a day (QID) | ORAL | Status: DC | PRN

## 2023-10-09 MED ORDER — ACETAMINOPHEN 10 MG/ML IV SOLN
INTRAVENOUS | Status: DC | PRN
  Administered 2023-10-09: 1000 mg via INTRAVENOUS

## 2023-10-09 MED ORDER — LEVOTHYROXINE SODIUM 25 MCG PO TABS
25.0000 ug | ORAL_TABLET | Freq: Every day | ORAL | Status: DC
  Administered 2023-10-10 – 2023-10-11 (×2): 25 ug via ORAL
  Filled 2023-10-09 (×2): qty 1

## 2023-10-09 MED ORDER — BUPIVACAINE LIPOSOME 1.3 % IJ SUSP
INTRAMUSCULAR | Status: AC
  Filled 2023-10-09: qty 20

## 2023-10-09 MED ORDER — DEXAMETHASONE SODIUM PHOSPHATE 10 MG/ML IJ SOLN
INTRAMUSCULAR | Status: AC
  Filled 2023-10-09: qty 1

## 2023-10-09 MED ORDER — PROPOFOL 10 MG/ML IV BOLUS
INTRAVENOUS | Status: DC | PRN
  Administered 2023-10-09: 130 mg via INTRAVENOUS

## 2023-10-09 MED ORDER — DOCUSATE SODIUM 100 MG PO CAPS
100.0000 mg | ORAL_CAPSULE | Freq: Two times a day (BID) | ORAL | Status: DC
  Administered 2023-10-09 – 2023-10-11 (×4): 100 mg via ORAL
  Filled 2023-10-09 (×4): qty 1

## 2023-10-09 MED ORDER — LIDOCAINE HCL (PF) 2 % IJ SOLN
INTRAMUSCULAR | Status: AC
  Filled 2023-10-09: qty 5

## 2023-10-09 MED ORDER — SODIUM CHLORIDE 0.45 % IV SOLN: INTRAVENOUS | Status: AC

## 2023-10-09 MED ORDER — FENTANYL CITRATE (PF) 250 MCG/5ML IJ SOLN
INTRAMUSCULAR | Status: DC | PRN
  Administered 2023-10-09: 100 ug via INTRAVENOUS
  Administered 2023-10-09: 50 ug via INTRAVENOUS

## 2023-10-09 MED ORDER — KETAMINE HCL 50 MG/5ML IJ SOSY
PREFILLED_SYRINGE | INTRAMUSCULAR | Status: AC
  Filled 2023-10-09: qty 5

## 2023-10-09 MED ORDER — CHLORHEXIDINE GLUCONATE 0.12 % MT SOLN
15.0000 mL | Freq: Once | OROMUCOSAL | Status: AC
  Administered 2023-10-09: 15 mL via OROMUCOSAL

## 2023-10-09 MED ORDER — FENTANYL CITRATE (PF) 100 MCG/2ML IJ SOLN
INTRAMUSCULAR | Status: AC
  Filled 2023-10-09: qty 2

## 2023-10-09 MED ORDER — SUGAMMADEX SODIUM 200 MG/2ML IV SOLN
INTRAVENOUS | Status: AC
  Filled 2023-10-09: qty 2

## 2023-10-09 MED ORDER — HYDROCODONE-ACETAMINOPHEN 5-325 MG PO TABS: 1.0000 | ORAL_TABLET | Freq: Four times a day (QID) | ORAL | 0 refills | Status: DC | PRN

## 2023-10-09 MED ORDER — CEFAZOLIN SODIUM-DEXTROSE 2-4 GM/100ML-% IV SOLN
2.0000 g | INTRAVENOUS | Status: AC
  Administered 2023-10-09: 2 g via INTRAVENOUS
  Filled 2023-10-09: qty 100

## 2023-10-09 MED ORDER — PHENYLEPHRINE 80 MCG/ML (10ML) SYRINGE FOR IV PUSH (FOR BLOOD PRESSURE SUPPORT)
PREFILLED_SYRINGE | INTRAVENOUS | Status: AC
  Filled 2023-10-09: qty 10

## 2023-10-09 MED ORDER — DEXAMETHASONE SODIUM PHOSPHATE 10 MG/ML IJ SOLN
INTRAMUSCULAR | Status: DC | PRN
Start: 2023-10-09 — End: 2023-10-09
  Administered 2023-10-09: 8 mg via INTRAVENOUS

## 2023-10-09 MED ORDER — MIDAZOLAM HCL 2 MG/2ML IJ SOLN
INTRAMUSCULAR | Status: AC
  Filled 2023-10-09: qty 2

## 2023-10-09 MED ORDER — LIDOCAINE HCL (CARDIAC) PF 100 MG/5ML IV SOSY
PREFILLED_SYRINGE | INTRAVENOUS | Status: DC | PRN
  Administered 2023-10-09 (×2): 50 mg via INTRAVENOUS

## 2023-10-09 MED ORDER — ONDANSETRON HCL 4 MG/2ML IJ SOLN
INTRAMUSCULAR | Status: DC | PRN
  Administered 2023-10-09: 4 mg via INTRAVENOUS

## 2023-10-09 MED ORDER — SUGAMMADEX SODIUM 200 MG/2ML IV SOLN
INTRAVENOUS | Status: DC | PRN
  Administered 2023-10-09: 200 mg via INTRAVENOUS

## 2023-10-09 MED ORDER — ACETAMINOPHEN 500 MG PO TABS
1000.0000 mg | ORAL_TABLET | Freq: Four times a day (QID) | ORAL | Status: AC
  Administered 2023-10-09 – 2023-10-10 (×4): 1000 mg via ORAL
  Filled 2023-10-09 (×4): qty 2

## 2023-10-09 MED ORDER — ONDANSETRON HCL 4 MG/2ML IJ SOLN
4.0000 mg | INTRAMUSCULAR | Status: DC | PRN
  Administered 2023-10-09: 4 mg via INTRAVENOUS
  Filled 2023-10-09: qty 2

## 2023-10-09 MED ORDER — HYOSCYAMINE SULFATE 0.125 MG SL SUBL
0.1250 mg | SUBLINGUAL_TABLET | SUBLINGUAL | Status: DC | PRN
  Administered 2023-10-11: 0.125 mg via SUBLINGUAL
  Filled 2023-10-09: qty 1

## 2023-10-09 MED ORDER — ORAL CARE MOUTH RINSE: 15.0000 mL | Freq: Once | OROMUCOSAL | Status: AC

## 2023-10-09 MED ORDER — DIPHENHYDRAMINE HCL 50 MG/ML IJ SOLN: 12.5000 mg | Freq: Four times a day (QID) | INTRAMUSCULAR | Status: DC | PRN

## 2023-10-09 MED ORDER — LACTATED RINGERS IV SOLN: INTRAVENOUS | Status: DC | PRN

## 2023-10-09 MED ORDER — PHENYLEPHRINE 80 MCG/ML (10ML) SYRINGE FOR IV PUSH (FOR BLOOD PRESSURE SUPPORT)
PREFILLED_SYRINGE | INTRAVENOUS | Status: DC | PRN
  Administered 2023-10-09: 80 ug via INTRAVENOUS

## 2023-10-09 MED ORDER — WATER FOR IRRIGATION, STERILE IR SOLN
Status: DC | PRN
  Administered 2023-10-09: 1000 mL

## 2023-10-09 SURGICAL SUPPLY — 57 items
BAG COUNTER SPONGE SURGICOUNT (BAG) IMPLANT
BAG URO CATCHER STRL LF (MISCELLANEOUS) IMPLANT
CATH URET 5FR 28IN OPEN ENDED (CATHETERS) IMPLANT
CHLORAPREP W/TINT 26 (MISCELLANEOUS) ×1 IMPLANT
CLIP LIGATING HEM O LOK PURPLE (MISCELLANEOUS) IMPLANT
CLIP LIGATING HEMO O LOK GREEN (MISCELLANEOUS) IMPLANT
COVER SURGICAL LIGHT HANDLE (MISCELLANEOUS) ×1 IMPLANT
COVER TIP SHEARS 8 DVNC (MISCELLANEOUS) ×1 IMPLANT
DERMABOND ADVANCED .7 DNX12 (GAUZE/BANDAGES/DRESSINGS) ×1 IMPLANT
DRAIN CHANNEL 15F RND FF 3/16 (WOUND CARE) ×1 IMPLANT
DRAPE ARM DVNC X/XI (DISPOSABLE) ×4 IMPLANT
DRAPE COLUMN DVNC XI (DISPOSABLE) ×1 IMPLANT
DRAPE INCISE IOBAN 66X45 STRL (DRAPES) ×1 IMPLANT
DRAPE SHEET LG 3/4 BI-LAMINATE (DRAPES) ×1 IMPLANT
DRIVER NDL LRG 8 DVNC XI (INSTRUMENTS) ×2 IMPLANT
DRIVER NDLE LRG 8 DVNC XI (INSTRUMENTS) ×2 IMPLANT
ELECT PENCIL ROCKER SW 15FT (MISCELLANEOUS) ×1 IMPLANT
ELECT REM PT RETURN 15FT ADLT (MISCELLANEOUS) ×1 IMPLANT
EVACUATOR SILICONE 100CC (DRAIN) ×1 IMPLANT
FORCEPS BPLR 8 MD DVNC XI (FORCEP) ×1 IMPLANT
FORCEPS PROGRASP DVNC XI (FORCEP) ×1 IMPLANT
GLOVE BIOGEL M 7.0 STRL (GLOVE) ×2 IMPLANT
GLOVE BIOGEL PI IND STRL 7.5 (GLOVE) ×2 IMPLANT
GOWN STRL REUS W/ TWL LRG LVL3 (GOWN DISPOSABLE) ×2 IMPLANT
GOWN STRL REUS W/ TWL XL LVL3 (GOWN DISPOSABLE) ×1 IMPLANT
GUIDEWIRE STR DUAL SENSOR (WIRE) ×1 IMPLANT
IRRIG SUCT STRYKERFLOW 2 WTIP (MISCELLANEOUS) ×1 IMPLANT
IRRIGATION SUCT STRKRFLW 2 WTP (MISCELLANEOUS) ×1 IMPLANT
KIT BASIN OR (CUSTOM PROCEDURE TRAY) ×1 IMPLANT
KIT TURNOVER KIT A (KITS) IMPLANT
LOOP VESSEL MAXI BLUE (MISCELLANEOUS) IMPLANT
NDL INSUFFLATION 14GA 120MM (NEEDLE) ×1 IMPLANT
NEEDLE INSUFFLATION 14GA 120MM (NEEDLE) ×1 IMPLANT
NS IRRIG 1000ML POUR BTL (IV SOLUTION) ×1 IMPLANT
PROTECTOR NERVE ULNAR (MISCELLANEOUS) ×2 IMPLANT
SCISSORS MNPLR CVD DVNC XI (INSTRUMENTS) ×1 IMPLANT
SEAL UNIV 5-12 XI (MISCELLANEOUS) ×3 IMPLANT
SET IRRIG Y TYPE TUR BLADDER L (SET/KITS/TRAYS/PACK) IMPLANT
SET TUBE SMOKE EVAC HIGH FLOW (TUBING) ×1 IMPLANT
SOL ELECTROSURG ANTI STICK (MISCELLANEOUS) ×1 IMPLANT
SOL PREP POV-IOD 4OZ 10% (MISCELLANEOUS) IMPLANT
SOLUTION ELECTROSURG ANTI STCK (MISCELLANEOUS) ×1 IMPLANT
SPIKE FLUID TRANSFER (MISCELLANEOUS) ×1 IMPLANT
SPONGE T-LAP 4X18 ~~LOC~~+RFID (SPONGE) IMPLANT
STENT URET 6FRX26 CONTOUR (STENTS) IMPLANT
SUT ETHILON 3 0 PS 1 (SUTURE) ×1 IMPLANT
SUT MNCRL 3 0 VIOLET RB1 (SUTURE) IMPLANT
SUT MNCRL AB 4-0 PS2 18 (SUTURE) IMPLANT
SUT VIC AB 0 CT1 36 (SUTURE) IMPLANT
SUT VIC AB 4-0 RB1 27XBRD (SUTURE) ×2 IMPLANT
TOWEL OR 17X26 10 PK STRL BLUE (TOWEL DISPOSABLE) ×1 IMPLANT
TRAY FOLEY MTR SLVR 16FR STAT (SET/KITS/TRAYS/PACK) ×1 IMPLANT
TRAY LAPAROSCOPIC (CUSTOM PROCEDURE TRAY) ×1 IMPLANT
TROCAR Z THREAD OPTICAL 12X100 (TROCAR) ×1 IMPLANT
TROCAR Z-THREAD OPTICAL 5X100M (TROCAR) IMPLANT
TUBING CONNECTING 10 (TUBING) ×1 IMPLANT
WATER STERILE IRR 1000ML POUR (IV SOLUTION) ×1 IMPLANT

## 2023-10-09 NOTE — Anesthesia Procedure Notes (Signed)
 Procedure Name: Intubation Date/Time: 10/09/2023 2:06 PM  Performed by: Lovie Chol, CRNAPre-anesthesia Checklist: Patient identified, Emergency Drugs available, Suction available and Patient being monitored Patient Re-evaluated:Patient Re-evaluated prior to induction Oxygen Delivery Method: Circle System Utilized Preoxygenation: Pre-oxygenation with 100% oxygen Induction Type: IV induction Ventilation: Mask ventilation without difficulty Laryngoscope Size: Miller and 3 Grade View: Grade I Tube type: Oral Tube size: 7.0 mm Number of attempts: 1 Airway Equipment and Method: Stylet and Oral airway Placement Confirmation: ETT inserted through vocal cords under direct vision, positive ETCO2 and breath sounds checked- equal and bilateral Secured at: 21 cm Tube secured with: Tape Dental Injury: Teeth and Oropharynx as per pre-operative assessment

## 2023-10-09 NOTE — Anesthesia Preprocedure Evaluation (Addendum)
 Anesthesia Evaluation  Patient identified by MRN, date of birth, ID band Patient awake    Reviewed: Allergy & Precautions, H&P , NPO status , Patient's Chart, lab work & pertinent test results  Airway Mallampati: II   Neck ROM: full    Dental  (+) Teeth Intact, Dental Advisory Given,    Pulmonary neg pulmonary ROS   breath sounds clear to auscultation       Cardiovascular negative cardio ROS  Rhythm:regular Rate:Normal     Neuro/Psych  PSYCHIATRIC DISORDERS  Depression    negative neurological ROS     GI/Hepatic Neg liver ROS,GERD  ,,  Endo/Other  Hypothyroidism    Renal/GU Renal InsufficiencyRenal disease     Musculoskeletal  (+) Arthritis ,    Abdominal   Peds  Hematology negative hematology ROS (+)   Anesthesia Other Findings RIGHT URETEROPELVIC JUCTION OBSTRUCTION  Reproductive/Obstetrics                             Anesthesia Physical Anesthesia Plan  ASA: 2  Anesthesia Plan: General   Post-op Pain Management:    Induction: Intravenous  PONV Risk Score and Plan: 3 and Ondansetron, Dexamethasone, Midazolam and Treatment may vary due to age or medical condition  Airway Management Planned: Oral ETT  Additional Equipment:   Intra-op Plan:   Post-operative Plan: Extubation in OR  Informed Consent: I have reviewed the patients History and Physical, chart, labs and discussed the procedure including the risks, benefits and alternatives for the proposed anesthesia with the patient or authorized representative who has indicated his/her understanding and acceptance.     Dental advisory given  Plan Discussed with: CRNA, Anesthesiologist and Surgeon  Anesthesia Plan Comments:        Anesthesia Quick Evaluation

## 2023-10-09 NOTE — Transfer of Care (Signed)
 Immediate Anesthesia Transfer of Care Note  Patient: Kimberly [name]  Procedure(s) Performed: RIGHT XI ROBOTIC ASSISTED PYELOPLASTY WITH STENT PLACEMENT (Right) CYSTOSCOPY, WITH RETROGRADE PYELOGRAM AND URETERAL STENT INSERTION (Right)  Patient Location: PACU  Anesthesia Type:General  Level of Consciousness: drowsy and patient cooperative  Airway & Oxygen Therapy: Patient Spontanous Breathing and Patient connected to face mask oxygen  Post-op Assessment: Report given to RN and Post -op Vital signs reviewed and stable  Post vital signs: Reviewed and stable  Last Vitals:  Vitals Value Taken Time  BP 149/98 10/09/23 1730  Temp    Pulse 80 10/09/23 1733  Resp 17 10/09/23 1733  SpO2 100 % 10/09/23 1733  Vitals shown include unfiled device data.  Last Pain:  Vitals:   10/09/23 1043  TempSrc:   PainSc: 0-No pain         Complications: No notable events documented.

## 2023-10-10 DIAGNOSIS — N130 Hydronephrosis with ureteropelvic junction obstruction: Secondary | ICD-10-CM | POA: Diagnosis not present

## 2023-10-10 LAB — BASIC METABOLIC PANEL
Anion gap: 8 (ref 5–15)
BUN: 17 mg/dL (ref 8–23)
CO2: 21 mmol/L — ABNORMAL LOW (ref 22–32)
Calcium: 8.4 mg/dL — ABNORMAL LOW (ref 8.9–10.3)
Chloride: 106 mmol/L (ref 98–111)
Creatinine, Ser: 1.11 mg/dL — ABNORMAL HIGH (ref 0.44–1.00)
GFR, Estimated: 52 mL/min — ABNORMAL LOW (ref >60)
Glucose, Bld: 110 mg/dL — ABNORMAL HIGH (ref 70–99)
Potassium: 4.5 mmol/L (ref 3.5–5.1)
Sodium: 135 mmol/L (ref 135–145)

## 2023-10-10 LAB — CREATININE, FLUID (PLEURAL, PERITONEAL, JP DRAINAGE): Creat, Fluid: 1.3 mg/dL

## 2023-10-10 LAB — HEMOGLOBIN AND HEMATOCRIT, BLOOD
HCT: 36.1 % (ref 36.0–46.0)
Hemoglobin: 11.4 g/dL — ABNORMAL LOW (ref 12.0–15.0)

## 2023-10-10 MED ORDER — POLYETHYLENE GLYCOL 3350 17 G PO PACK
17.0000 g | PACK | Freq: Every day | ORAL | Status: DC
Start: 2023-10-10 — End: 2023-10-11
  Administered 2023-10-10 – 2023-10-11 (×2): 17 g via ORAL
  Filled 2023-10-10 (×2): qty 1

## 2023-10-10 NOTE — Progress Notes (Signed)
 1 Day Post-Op Subjective: Patient complaining of pain when getting out of bed.  Minimal pain with ambulation.  Nausea without emesis last night.  Has poor appetite.  She was able to void this morning, but is not passing flatus yet.  Objective: Vital signs in last 24 hours: Temp:  [97.3 F (36.3 C)-98.9 F (37.2 C)] 97.9 F (36.6 C) (03/22 0700) Pulse Rate:  [61-81] 63 (03/22 0700) Resp:  [14-20] 18 (03/22 0700) BP: (118-152)/(60-98) 124/65 (03/22 0700) SpO2:  [94 %-100 %] 98 % (03/22 0700) Weight:  [77.1 kg-80.2 kg] 80.2 kg (03/21 1847)  Intake/Output from previous day: 03/21 0701 - 03/22 0700 In: 2416.3 [P.O.:120; I.V.:2196.3; IV Piggyback:100] Out: 1135 [Urine:1100; Drains:15; Blood:20]  Intake/Output this shift: No intake/output data recorded.  Physical Exam:  General: Alert and oriented CV: RRR, palpable distal pulses Lungs: CTAB, equal chest rise Abdomen: Soft, NTND, no rebound or guarding Incisions: Clean, dry and intact.  JP drain with serosanguineous output   Lab Results: Recent Labs    10/10/23 0427  HGB 11.4*  HCT 36.1   BMET Recent Labs    10/10/23 0427  NA 135  K 4.5  CL 106  CO2 21*  GLUCOSE 110*  BUN 17  CREATININE 1.11*  CALCIUM 8.4*     Studies/Results: DG C-Arm 1-60 Min-No Report Result Date: 10/09/2023 Fluoroscopy was utilized by the requesting physician.  No radiographic interpretation.    Assessment/Plan: POD 1 s/p right robotic pyeloplasty with Dr. Cardell Peach  -Out of bed to chair and ambulate -Advance diet as tolerated -JP creatinine pending.  Will likely remove this morning -Okay to discharge if pain is controlled with oral meds, tolerating a regular diet, passing flatus and ambulating without difficulty   LOS: 0 days   Rhoderick Moody, MD [facility] Pager: [phone]  10/10/2023, 9:10 AM

## 2023-10-10 NOTE — Care Management Obs Status (Signed)
 MEDICARE OBSERVATION STATUS NOTIFICATION   Patient Details  Name: Kimberly [name] MRN: [id number] Date of Birth: 10-03-1950   Medicare Observation Status Notification Given:  Yes    Larrie Kass, LCSW 10/10/2023, 3:53 PM

## 2023-10-10 NOTE — Progress Notes (Signed)
 Foley catheter removed. Bladder scan read 0ml. Pt in no acute distress. Will continue to monitor pt.

## 2023-10-11 DIAGNOSIS — N130 Hydronephrosis with ureteropelvic junction obstruction: Secondary | ICD-10-CM | POA: Diagnosis not present

## 2023-10-11 NOTE — Discharge Summary (Signed)
 Date of admission: 10/09/2023  Date of discharge: 10/11/2023  Admission diagnosis: Right UPJ obstruction  Discharge diagnosis: Right UPJ obstruction  Procedures: Robotic right pyeloplasty with ureteral stent placement on 10/09/2023 with Dr. Cardell Peach  History and Physical: For full details, please see admission history and physical. Briefly, Kimberly [name] is a 73 y.o. year old patient with a right sided UPJ obstruction and is status post right robotic pyeloplasty on 10/09/2023 with Dr. Cardell Peach.   Hospital Course: Routine postoperative course.  By postop day 2, the patient's was controlled, was ambulating without difficulty, tolerating a regular diet, urinating and passing flatus.  Physical Exam:  General: Alert and oriented CV: RRR, palpable distal pulses Lungs: CTAB, equal chest rise Abdomen: Soft, NTND, no rebound or guarding Incisions: Clean, dry and intact Ext: NT, No erythema  Laboratory values:  Recent Labs    10/10/23 0427  HGB 11.4*  HCT 36.1   Recent Labs    10/10/23 0427  CREATININE 1.11*    Disposition: Home  Discharge instruction: The patient was instructed to be ambulatory but told to refrain from heavy lifting, strenuous activity, or driving.  Discharge medications:  Allergies as of 10/11/2023   No Known Allergies      Medication List     STOP taking these medications    cholecalciferol 25 MCG (1000 UNIT) tablet Commonly known as: VITAMIN D3   Melatonin 10 MG Chew   PROBIOTIC ACIDOPHILUS PO       TAKE these medications    docusate sodium 250 MG capsule Commonly known as: COLACE Take 250 mg by mouth 2 (two) times daily as needed for constipation.   ferrous sulfate 325 (65 FE) MG tablet Take 325 mg by mouth 3 (three) times a week.   HYDROcodone-acetaminophen 5-325 MG tablet Commonly known as: NORCO/VICODIN Take 1-2 tablets by mouth every 6 (six) hours as needed for moderate pain (pain score 4-6) or severe pain (pain score 7-10).   levothyroxine 25  MCG tablet Commonly known as: SYNTHROID Take 1 tablet (25 mcg total) by mouth daily before breakfast.   pantoprazole 40 MG tablet Commonly known as: PROTONIX Take 1 tablet (40 mg total) by mouth daily.        Followup:   Follow-up Information     [facility] Follow up on 10/23/2023.   Why: 11 AM Contact information: [address] [place] [address] [phone]

## 2023-10-11 NOTE — Plan of Care (Signed)

## 2023-10-12 ENCOUNTER — Other Ambulatory Visit: Payer: Self-pay

## 2023-10-12 ENCOUNTER — Encounter (HOSPITAL_COMMUNITY): Payer: Self-pay | Admitting: Urology

## 2023-10-13 LAB — SURGICAL PATHOLOGY

## 2023-10-13 NOTE — Anesthesia Postprocedure Evaluation (Signed)
 Anesthesia Post Note  Patient: Kimberly [name]  Procedure(s) Performed: RIGHT XI ROBOTIC ASSISTED PYELOPLASTY WITH STENT PLACEMENT (Right) CYSTOSCOPY, WITH RETROGRADE PYELOGRAM AND URETERAL STENT INSERTION (Right)     Patient location during evaluation: PACU Anesthesia Type: General Level of consciousness: awake and alert Pain management: pain level controlled Vital Signs Assessment: post-procedure vital signs reviewed and stable Respiratory status: spontaneous breathing, nonlabored ventilation, respiratory function stable and patient connected to nasal cannula oxygen Cardiovascular status: blood pressure returned to baseline and stable Postop Assessment: no apparent nausea or vomiting Anesthetic complications: no   No notable events documented.  Last Vitals:  Vitals:   10/11/23 0457 10/11/23 0800  BP: 129/67 (!) 119/53  Pulse: 67 87  Resp: 16   Temp: 36.9 C 37.1 C  SpO2: 99% 100%    Last Pain:  Vitals:   10/11/23 1159  TempSrc:   PainSc: 4                  Darshana Curnutt S

## 2023-10-14 ENCOUNTER — Other Ambulatory Visit: Payer: Self-pay | Admitting: Family Medicine

## 2023-10-14 DIAGNOSIS — E039 Hypothyroidism, unspecified: Secondary | ICD-10-CM

## 2023-10-23 DIAGNOSIS — N130 Hydronephrosis with ureteropelvic junction obstruction: Secondary | ICD-10-CM | POA: Diagnosis not present

## 2023-10-23 DIAGNOSIS — R8271 Bacteriuria: Secondary | ICD-10-CM | POA: Diagnosis not present

## 2023-11-02 ENCOUNTER — Other Ambulatory Visit (HOSPITAL_COMMUNITY): Payer: Self-pay | Admitting: Urology

## 2023-11-02 DIAGNOSIS — N130 Hydronephrosis with ureteropelvic junction obstruction: Secondary | ICD-10-CM

## 2023-11-11 ENCOUNTER — Ambulatory Visit (INDEPENDENT_AMBULATORY_CARE_PROVIDER_SITE_OTHER): Admitting: Family Medicine

## 2023-11-11 ENCOUNTER — Ambulatory Visit (HOSPITAL_COMMUNITY)
Admission: RE | Admit: 2023-11-11 | Discharge: 2023-11-11 | Disposition: A | Discharge status: Home / Self Care | Source: Ambulatory Visit | Attending: Urology | Admitting: Urology

## 2023-11-11 ENCOUNTER — Encounter: Payer: Self-pay | Admitting: Family Medicine

## 2023-11-11 VITALS — BP 115/67 | HR 60 | Temp 98.2°F | Ht 64.0 in | Wt 169.0 lb

## 2023-11-11 DIAGNOSIS — E039 Hypothyroidism, unspecified: Secondary | ICD-10-CM | POA: Diagnosis not present

## 2023-11-11 DIAGNOSIS — N2889 Other specified disorders of kidney and ureter: Secondary | ICD-10-CM | POA: Diagnosis not present

## 2023-11-11 DIAGNOSIS — N130 Hydronephrosis with ureteropelvic junction obstruction: Secondary | ICD-10-CM | POA: Diagnosis not present

## 2023-11-11 DIAGNOSIS — N1330 Unspecified hydronephrosis: Secondary | ICD-10-CM | POA: Diagnosis not present

## 2023-11-11 DIAGNOSIS — R4589 Other symptoms and signs involving emotional state: Secondary | ICD-10-CM

## 2023-11-11 DIAGNOSIS — N1831 Chronic kidney disease, stage 3a: Secondary | ICD-10-CM | POA: Diagnosis not present

## 2023-11-11 MED ORDER — FUROSEMIDE 10 MG/ML IJ SOLN
INTRAMUSCULAR | Status: AC
  Filled 2023-11-11: qty 4

## 2023-11-11 MED ORDER — FUROSEMIDE 10 MG/ML IJ SOLN
38.4000 mg | Freq: Once | INTRAMUSCULAR | Status: AC
  Administered 2023-11-11: 38 mg via INTRAVENOUS

## 2023-11-11 MED ORDER — TECHNETIUM TC 99M MERTIATIDE
5.0000 | Freq: Once | INTRAVENOUS | Status: AC
  Administered 2023-11-11: 5 via INTRAVENOUS

## 2023-11-11 MED ORDER — LEVOTHYROXINE SODIUM 25 MCG PO TABS: 25.0000 ug | ORAL_TABLET | Freq: Every day | ORAL | 3 refills | Status: DC
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
## 2023-11-12 LAB — CBC
Hematocrit: 36.4 % (ref 34.0–46.6)
Hemoglobin: 11.9 g/dL (ref 11.1–15.9)
MCH: 30.1 pg (ref 26.6–33.0)
MCHC: 32.7 g/dL (ref 31.5–35.7)
MCV: 92 fL (ref 79–97)
Platelets: 183 10*3/uL (ref 150–450)
RBC: 3.95 x10E6/uL (ref 3.77–5.28)
RDW: 12.6 % (ref 11.7–15.4)
WBC: 4.7 10*3/uL (ref 3.4–10.8)

## 2023-11-12 LAB — BASIC METABOLIC PANEL WITH GFR
BUN/Creatinine Ratio: 14 (ref 12–28)
BUN: 13 mg/dL (ref 8–27)
CO2: 21 mmol/L (ref 20–29)
Calcium: 9.1 mg/dL (ref 8.7–10.3)
Chloride: 105 mmol/L (ref 96–106)
Creatinine, Ser: 0.92 mg/dL (ref 0.57–1.00)
Glucose: 90 mg/dL (ref 70–99)
Potassium: 4.2 mmol/L (ref 3.5–5.2)
Sodium: 141 mmol/L (ref 134–144)
eGFR: 66 mL/min/{1.73_m2} (ref >59)

## 2023-11-12 LAB — TSH+FREE T4
Free T4: 1.31 ng/dL (ref 0.82–1.77)
TSH: 2.48 u[IU]/mL (ref 0.450–4.500)

## 2023-11-13 ENCOUNTER — Encounter: Payer: Self-pay | Admitting: Family Medicine

## 2023-11-25 DIAGNOSIS — N130 Hydronephrosis with ureteropelvic junction obstruction: Secondary | ICD-10-CM | POA: Diagnosis not present
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
## 2023-11-26 ENCOUNTER — Encounter: Payer: Self-pay | Admitting: Family Medicine

## 2023-11-26 ENCOUNTER — Ambulatory Visit: Admitting: Family Medicine

## 2023-11-26 VITALS — BP 110/82 | HR 72 | Ht 64.0 in | Wt 171.0 lb

## 2023-11-26 DIAGNOSIS — M9902 Segmental and somatic dysfunction of thoracic region: Secondary | ICD-10-CM

## 2023-11-26 DIAGNOSIS — M9904 Segmental and somatic dysfunction of sacral region: Secondary | ICD-10-CM

## 2023-11-26 DIAGNOSIS — M4316 Spondylolisthesis, lumbar region: Secondary | ICD-10-CM

## 2023-11-26 DIAGNOSIS — M9903 Segmental and somatic dysfunction of lumbar region: Secondary | ICD-10-CM | POA: Diagnosis not present

## 2023-11-26 DIAGNOSIS — M9901 Segmental and somatic dysfunction of cervical region: Secondary | ICD-10-CM

## 2023-11-26 NOTE — Patient Instructions (Signed)
 Great to see you Start to increase activity slowly See me in 7-8 weeks

## 2023-11-26 NOTE — Assessment & Plan Note (Signed)
 Continue to work on the spondylolisthesis and the core strengthening.  Patient unfortunately did have a very bad kidney function and is much better overall.  Discussed which activities to do ones to avoid.  Increase activity slowly otherwise.  Follow-up again in 6 to 8 weeks

## 2023-12-11 ENCOUNTER — Other Ambulatory Visit

## 2023-12-11 DIAGNOSIS — E559 Vitamin D deficiency, unspecified: Secondary | ICD-10-CM | POA: Diagnosis not present

## 2023-12-11 DIAGNOSIS — E211 Secondary hyperparathyroidism, not elsewhere classified: Secondary | ICD-10-CM | POA: Diagnosis not present

## 2023-12-11 DIAGNOSIS — D631 Anemia in chronic kidney disease: Secondary | ICD-10-CM | POA: Diagnosis not present

## 2023-12-11 DIAGNOSIS — R809 Proteinuria, unspecified: Secondary | ICD-10-CM | POA: Diagnosis not present

## 2023-12-11 DIAGNOSIS — N189 Chronic kidney disease, unspecified: Secondary | ICD-10-CM | POA: Diagnosis not present

## 2023-12-23 DIAGNOSIS — I739 Peripheral vascular disease, unspecified: Secondary | ICD-10-CM | POA: Diagnosis not present

## 2023-12-23 DIAGNOSIS — L600 Ingrowing nail: Secondary | ICD-10-CM | POA: Diagnosis not present

## 2023-12-23 DIAGNOSIS — S90221A Contusion of right lesser toe(s) with damage to nail, initial encounter: Secondary | ICD-10-CM | POA: Diagnosis not present

## 2023-12-23 DIAGNOSIS — M792 Neuralgia and neuritis, unspecified: Secondary | ICD-10-CM | POA: Diagnosis not present

## 2023-12-23 DIAGNOSIS — S90222A Contusion of left lesser toe(s) with damage to nail, initial encounter: Secondary | ICD-10-CM | POA: Diagnosis not present

## 2024-01-11 DIAGNOSIS — M792 Neuralgia and neuritis, unspecified: Secondary | ICD-10-CM | POA: Diagnosis not present

## 2024-01-11 DIAGNOSIS — L600 Ingrowing nail: Secondary | ICD-10-CM | POA: Diagnosis not present
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
## 2024-01-13 ENCOUNTER — Other Ambulatory Visit

## 2024-01-13 DIAGNOSIS — N189 Chronic kidney disease, unspecified: Secondary | ICD-10-CM | POA: Diagnosis not present

## 2024-01-14 ENCOUNTER — Ambulatory Visit (INDEPENDENT_AMBULATORY_CARE_PROVIDER_SITE_OTHER): Admitting: Family Medicine

## 2024-01-14 ENCOUNTER — Encounter: Payer: Self-pay | Admitting: Family Medicine

## 2024-01-14 VITALS — BP 102/68 | Ht 64.0 in | Wt 169.0 lb

## 2024-01-14 DIAGNOSIS — M9902 Segmental and somatic dysfunction of thoracic region: Secondary | ICD-10-CM | POA: Diagnosis not present

## 2024-01-14 DIAGNOSIS — M9904 Segmental and somatic dysfunction of sacral region: Secondary | ICD-10-CM

## 2024-01-14 DIAGNOSIS — M9901 Segmental and somatic dysfunction of cervical region: Secondary | ICD-10-CM | POA: Diagnosis not present

## 2024-01-14 DIAGNOSIS — M9903 Segmental and somatic dysfunction of lumbar region: Secondary | ICD-10-CM | POA: Diagnosis not present

## 2024-01-14 DIAGNOSIS — M4316 Spondylolisthesis, lumbar region: Secondary | ICD-10-CM | POA: Diagnosis not present

## 2024-01-14 DIAGNOSIS — M9908 Segmental and somatic dysfunction of rib cage: Secondary | ICD-10-CM

## 2024-01-14 NOTE — Patient Instructions (Signed)
 Good to see you Try to slowly increase activity See me in 2 months

## 2024-01-14 NOTE — Assessment & Plan Note (Signed)
 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  Likely some intermittent nerve root impingement.  Discussed icing regimen and home exercises, icing regimen.  Which activities to do in which ones to avoid.  Patient has not been as active after her surgery still.  With the heat of the day as well having some difficulty.  Patient also has had low blood pressure.  Social determinant of health is significantly in fact they have working out 0 days a week at the moment.

## 2024-01-25 DIAGNOSIS — L600 Ingrowing nail: Secondary | ICD-10-CM | POA: Diagnosis not present

## 2024-01-25 DIAGNOSIS — M792 Neuralgia and neuritis, unspecified: Secondary | ICD-10-CM | POA: Diagnosis not present

## 2024-02-01 ENCOUNTER — Ambulatory Visit: Payer: Self-pay

## 2024-02-01 DIAGNOSIS — N130 Hydronephrosis with ureteropelvic junction obstruction: Secondary | ICD-10-CM | POA: Diagnosis not present

## 2024-02-01 DIAGNOSIS — R3121 Asymptomatic microscopic hematuria: Secondary | ICD-10-CM | POA: Diagnosis not present

## 2024-02-01 NOTE — Telephone Encounter (Signed)
 E2C2 scheduled appointment.

## 2024-02-01 NOTE — Telephone Encounter (Signed)
 APPOINTMENT TOMORROW 02/01/2024 AT 8 AM WITH Kimberly ST MORTON HUMMER, NP   FYI Only or Action Required?: FYI only for provider.  Patient was last seen in primary care on 11/11/2023 by [name], DO.  Called Nurse Triage reporting Rash.  Symptoms began OVER A WEEK AGO.  Interventions attempted: OTC medications: CREAM FOR ITCHING--NOT HELPING A LOT.  Symptoms are: gradually worsening.  Triage Disposition: See Physician Within 24 Hours (overriding See HCP Within 4 Hours (Or PCP Triage))  Patient/caregiver understands and will follow disposition?: Yes       Called [facility] because patient did not want to go to Urgent Care today and she wanted to make an appointment for tomorrow ( 02/02/2024 )                    Copied from CRM #[id number]. Topic: Clinical - Red Word Triage >> Feb 01, 2024 10:44 AM Selinda RAMAN wrote: Red Word that prompted transfer to Nurse Triage: The patient called in stating she has had a rash all over since last week. She has used a cream that has not helped. She said it is on her face, neck and really all over. I will transfer her to E2C2 NT Reason for Disposition  Large or small blisters on skin (i.e., fluid filled bubbles or sacs)  Answer Assessment - Initial Assessment Questions Using a cream for itch but not helping   1. APPEARANCE of RASH: What does the rash look like? (e.g., blisters, dry flaky skin, red spots, redness, sores)     Red spots    hands have rashes with blisters 2. SIZE: How big are the spots? (e.g., tip of pen, eraser, coin; inches, centimeters)     varies 3. LOCATION: Where is the rash located?     Left leg, neck, face, and bra line.  Both hands have rashes too and they have blisters 4. COLOR: What color is the rash? (Note: It is difficult to assess rash color in people with darker-colored skin. When this situation occurs, simply ask the caller to describe what they see.)     Red 5. ONSET: When did the rash  begin?     Last week---after pulling in the cucumbers in the garden or maybe a little before 6. FEVER: Do you have a fever? If Yes, ask: What is your temperature, how was it measured, and when did it start?     No 7. ITCHING: Does the rash itch? If Yes, ask: How bad is the itch? (Scale 1-10; or mild, moderate, severe)     Terribly 8. CAUSE: What do you think is causing the rash?     Unknown 9. MEDICINE FACTORS: Have you started any new medicines within the last 2 weeks? (e.g., antibiotics)      No 10. OTHER SYMPTOMS: Do you have any other symptoms? (e.g., dizziness, headache, sore throat, joint pain)       No  Protocols used: Rash or Redness - [facility]

## 2024-02-01 NOTE — Progress Notes (Unsigned)
   Acute Office Visit  Subjective:     Patient ID: Kimberly [name], female    DOB: 09/17/1950, 73 y.o.   MRN: [id number]  No chief complaint on file.   HPI  ROS Negative unless indicated in HPI    Objective:    There were no vitals taken for this visit. {Vitals History (Optional):23777}  Physical Exam  No results found for any visits on 02/02/24.      Assessment & Plan:  There are no diagnoses linked to this encounter.  No follow-ups on file.  @Maitri Schnoebelen  Sebastian DNP@  Note: This document was prepared by [employer] voice dictation technology and any errors that results from this process are unintentional.

## 2024-02-02 ENCOUNTER — Encounter: Payer: Self-pay | Admitting: Nurse Practitioner

## 2024-02-02 ENCOUNTER — Ambulatory Visit (INDEPENDENT_AMBULATORY_CARE_PROVIDER_SITE_OTHER): Admitting: Nurse Practitioner

## 2024-02-02 VITALS — BP 99/64 | HR 68 | Temp 98.2°F | Ht 64.0 in | Wt 170.0 lb

## 2024-02-02 DIAGNOSIS — L237 Allergic contact dermatitis due to plants, except food: Secondary | ICD-10-CM | POA: Diagnosis not present

## 2024-02-02 MED ORDER — TRIAMCINOLONE ACETONIDE 0.1 % EX CREA: 1.0000 | TOPICAL_CREAM | Freq: Two times a day (BID) | CUTANEOUS | 0 refills | Status: DC

## 2024-02-02 MED ORDER — METHYLPREDNISOLONE ACETATE 40 MG/ML IJ SUSP
40.0000 mg | Freq: Once | INTRAMUSCULAR | Status: AC
  Administered 2024-02-02: 40 mg via INTRAMUSCULAR

## 2024-02-05 ENCOUNTER — Encounter: Payer: Self-pay | Admitting: Family Medicine

## 2024-02-08 NOTE — Telephone Encounter (Signed)
 I called pt and scheduled her an appointment with Dr KANDICE 7/28 at 10:15.

## 2024-02-08 NOTE — Telephone Encounter (Signed)
 I would like her to come in for an evaluation, which will likely include Zio patch if she is having persistent issues

## 2024-02-15 ENCOUNTER — Ambulatory Visit (INDEPENDENT_AMBULATORY_CARE_PROVIDER_SITE_OTHER): Admitting: Family Medicine

## 2024-02-15 ENCOUNTER — Other Ambulatory Visit (INDEPENDENT_AMBULATORY_CARE_PROVIDER_SITE_OTHER)

## 2024-02-15 ENCOUNTER — Encounter: Payer: Self-pay | Admitting: Family Medicine

## 2024-02-15 VITALS — BP 104/65 | HR 68 | Temp 98.5°F | Ht 64.0 in | Wt 168.0 lb

## 2024-02-15 DIAGNOSIS — R001 Bradycardia, unspecified: Secondary | ICD-10-CM

## 2024-02-15 NOTE — Patient Instructions (Signed)
 Bradycardia, Adult Bradycardia is a slower-than-normal heartbeat. A normal resting heart rate for an adult ranges from 60 to 100 beats per minute. With bradycardia, the resting heart rate is less than 60 beats per minute. Bradycardia can prevent enough oxygen from reaching certain areas of your body when you are active. It can be serious if it keeps enough oxygen from reaching your brain and other parts of your body. Bradycardia is not a problem for everyone. For some healthy adults, a slow resting heart rate is normal. What are the causes? This condition may be caused by: A problem with the heart, including: A problem with the heart's electrical system, such as a heart block. With a heart block, electrical signals between the chambers of the heart are partially or completely blocked, so they are not able to work as they should. A problem with the heart's natural pacemaker (sinus node). Heart disease. A heart attack. Heart damage. Lyme disease. A heart infection. A heart condition that is present at birth (congenital heart defect). Certain medicines that treat heart conditions. Certain conditions, such as hypothyroidism and obstructive sleep apnea. Problems with the balance of chemicals and other substances, like potassium, in the blood. Trauma. Radiation therapy. What increases the risk? You are more likely to develop this condition if you: Are age 73 or older. Have high blood pressure (hypertension), high cholesterol (hyperlipidemia), or diabetes. Drink heavily, use tobacco or nicotine products, or use drugs. What are the signs or symptoms? Symptoms of this condition include: Light-headedness. Feeling faint or fainting. Fatigue and weakness. Trouble with activity or exercise. Shortness of breath. Chest pain (angina). Drowsiness. Confusion. Dizziness. How is this diagnosed? This condition may be diagnosed based on: Your symptoms. Your medical history. A physical exam. During  the exam, your health care provider will listen to your heartbeat and check your pulse. To confirm the diagnosis, your health care provider may order tests, such as: Blood tests. An electrocardiogram (ECG). This test records the heart's electrical activity. The test can show how fast your heart is beating and whether the heartbeat is steady. A test in which you wear a portable device (event recorder or Holter monitor) to record your heart's electrical activity while you go about your day. An exercise test. How is this treated? Treatment for this condition depends on the cause of the condition and how severe your symptoms are. Treatment may involve: Treatment of the underlying condition. Changing your medicines or how much medicine you take. Having a small, battery-operated device called a pacemaker implanted under the skin. When bradycardia occurs, this device can be used to increase your heart rate and help your heart beat in a regular rhythm. Follow these instructions at home: Lifestyle Manage any health conditions that contribute to bradycardia as told by your health care provider. Follow a heart-healthy diet. A nutrition specialist (dietitian) can help educate you about healthy food options and changes. Follow an exercise program that is approved by your health care provider. Maintain a healthy weight. Try to reduce or manage your stress, such as with yoga or meditation. If you need help reducing stress, ask your health care provider. Do not use any products that contain nicotine or tobacco. These products include cigarettes, chewing tobacco, and vaping devices, such as e-cigarettes. If you need help quitting, ask your health care provider. Do not use illegal drugs. Alcohol use If you drink alcohol: Limit how much you have to: 0-1 drink a day for women who are not pregnant. 0-2 drinks a day  for men. Know how much alcohol is in a drink. In the [place] one drink equals one 12 oz bottle of  beer (355 mL), one 5 oz glass of wine (148 mL), or one 1 oz glass of hard liquor (44 mL). General instructions Take over-the-counter and prescription medicines only as told by your health care provider. Keep all follow-up visits. This is important. How is this prevented? In some cases, bradycardia may be prevented by: Treating underlying medical problems. Stopping behaviors or medicines that can trigger the condition. Contact a health care provider if: You feel light-headed or dizzy. You almost faint. You feel weak or are easily fatigued during physical activity. You experience confusion or have memory problems. Get help right away if: You faint. You have chest pains or an irregular heartbeat (palpitations). You have trouble breathing. These symptoms may represent a serious problem that is an emergency. Do not wait to see if the symptoms will go away. Get medical help right away. Call your local emergency services (911 in the [place] Do not drive yourself to the hospital. Summary Bradycardia is a slower-than-normal heartbeat. With bradycardia, the resting heart rate is less than 60 beats per minute. Treatment for this condition depends on the cause. Manage any health conditions that contribute to bradycardia as told by your health care provider. Do not use any products that contain nicotine or tobacco. These products include cigarettes, chewing tobacco, and vaping devices, such as e-cigarettes. Keep all follow-up visits. This is important. This information is not intended to replace advice given to you by your health care provider. Make sure you discuss any questions you have with your health care provider. Document Revised: 10/28/2020 Document Reviewed: 10/28/2020 Elsevier Patient Education  2024 [employer].
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
## 2024-02-16 ENCOUNTER — Ambulatory Visit: Payer: Self-pay | Admitting: Family Medicine

## 2024-02-16 DIAGNOSIS — R001 Bradycardia, unspecified: Secondary | ICD-10-CM

## 2024-02-16 DIAGNOSIS — R000 Tachycardia, unspecified: Secondary | ICD-10-CM

## 2024-02-16 DIAGNOSIS — I441 Atrioventricular block, second degree: Secondary | ICD-10-CM

## 2024-02-16 LAB — CBC
Hematocrit: 39.8 % (ref 34.0–46.6)
Hemoglobin: 13 g/dL (ref 11.1–15.9)
MCH: 31 pg (ref 26.6–33.0)
MCHC: 32.7 g/dL (ref 31.5–35.7)
MCV: 95 fL (ref 79–97)
Platelets: 207 x10E3/uL (ref 150–450)
RBC: 4.2 x10E6/uL (ref 3.77–5.28)
RDW: 12.5 % (ref 11.7–15.4)
WBC: 6.3 x10E3/uL (ref 3.4–10.8)

## 2024-02-16 LAB — BASIC METABOLIC PANEL WITH GFR
BUN/Creatinine Ratio: 13 (ref 12–28)
BUN: 16 mg/dL (ref 8–27)
CO2: 21 mmol/L (ref 20–29)
Calcium: 9.1 mg/dL (ref 8.7–10.3)
Chloride: 107 mmol/L — AB (ref 96–106)
Creatinine, Ser: 1.24 mg/dL — AB (ref 0.57–1.00)
Glucose: 98 mg/dL (ref 70–99)
Potassium: 4.7 mmol/L (ref 3.5–5.2)
Sodium: 143 mmol/L (ref 134–144)
eGFR: 46 mL/min/1.73 — AB (ref >59)

## 2024-02-16 LAB — TSH+FREE T4
Free T4: 1.33 ng/dL (ref 0.82–1.77)
TSH: 1.85 u[IU]/mL (ref 0.450–4.500)

## 2024-02-16 LAB — MAGNESIUM: Magnesium: 2 mg/dL (ref 1.6–2.3)

## 2024-03-04 ENCOUNTER — Telehealth: Payer: Self-pay | Admitting: Pharmacist

## 2024-03-04 ENCOUNTER — Encounter: Payer: Self-pay | Admitting: Pharmacist

## 2024-03-04 NOTE — Telephone Encounter (Signed)
   This patient is appearing on a report for being at risk of failing the adherence measure for cholesterol (statin) medications this calendar year.   Medication: rosuvastatin  Has been on atorvastatin  in the past Last fill date: 10/14/23 for 90 day supply Patient declines future statin therapy  MyChart message sent to patient.   Kimberly [name], PharmD, BCACP, CPP Clinical Pharmacist, [facility]

## 2024-03-08 ENCOUNTER — Ambulatory Visit: Payer: Self-pay

## 2024-03-08 ENCOUNTER — Encounter: Payer: Self-pay | Admitting: Family Medicine

## 2024-03-08 ENCOUNTER — Ambulatory Visit (INDEPENDENT_AMBULATORY_CARE_PROVIDER_SITE_OTHER): Admitting: Family Medicine

## 2024-03-08 VITALS — BP 108/56 | HR 79 | Temp 97.3°F | Ht 64.0 in | Wt 172.0 lb

## 2024-03-08 DIAGNOSIS — I959 Hypotension, unspecified: Secondary | ICD-10-CM | POA: Diagnosis not present

## 2024-03-08 DIAGNOSIS — E039 Hypothyroidism, unspecified: Secondary | ICD-10-CM | POA: Diagnosis not present

## 2024-03-08 DIAGNOSIS — R748 Abnormal levels of other serum enzymes: Secondary | ICD-10-CM | POA: Diagnosis not present

## 2024-03-08 NOTE — Telephone Encounter (Signed)
 1st attempt---left a voicemail for patient to call back

## 2024-03-08 NOTE — Telephone Encounter (Signed)
 FYI Only or Action Required?: FYI only for provider.  Patient was last seen in primary care on 02/15/2024 by [name], DO.  Called Nurse Triage reporting low bp.  Symptoms began several days ago.  Interventions attempted: Nothing.  Symptoms are: stable.  Triage Disposition: See Physician Within 24 Hours  Patient/caregiver understands and will follow disposition?: Yes     Copied from CRM #[id number]. Topic: Clinical - Pink Word Triage >> Mar 08, 2024 11:00 AM Myrick T wrote: Reason for Triage: Patient called stated she has been taking her BP over the last couple of days and she has been 83/58, 92/61, 86/70, 82/59 and this morning it was 99/68. She has had a headache as well but no other symptoms. Please f/u with patient >> Mar 08, 2024 11:45 AM Gustabo D wrote: [facility] triage sent  >> Mar 08, 2024 11:04 AM Myrick T wrote: Patient called stated she has been taking her BP over the last couple of days and she has been 83/58, 92/61, 86/70, 82/59 and this morning it was 99/68. She has had a headache as well but no other symptoms. Please f/u with patient Reason for Disposition  [1] Systolic BP 90-110 AND [2] taking blood pressure medications AND [3] NOT feeling weak or lightheaded  Answer Assessment - Initial Assessment Questions 1. BLOOD PRESSURE: What is your blood pressure? Did you take at least two measurements 5 minutes apart?     99/98 2. ONSET: When did you take your blood pressure?     Automatic cuff 3. HOW: How did you take your blood pressure? (e.g., visiting nurse, automatic home BP monitor)     At home; self 4. HISTORY: Do you have a history of low blood pressure? What is your blood pressure normally?     ht 5. MEDICINES: Are you taking any medicines for blood pressure? If Yes, ask: Have they been changed recently?     denies 6. PULSE RATE: Do you know what your pulse rate is?      63 7. OTHER SYMPTOMS: Have you been sick recently? Have you  had a recent injury?     denies 8. PREGNANCY: Is there any chance you are pregnant? When was your last menstrual period?     na  Protocols used: Blood Pressure - Low-A-AH
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
## 2024-03-08 NOTE — Telephone Encounter (Signed)
 Patient had already been triaged & schedule---disregard this note

## 2024-03-08 NOTE — Telephone Encounter (Signed)
 Apt scheduled.

## 2024-03-09 ENCOUNTER — Ambulatory Visit: Payer: Self-pay | Admitting: Family Medicine

## 2024-03-09 LAB — CMP14+EGFR
ALT: 9 IU/L (ref 0–32)
AST: 15 IU/L (ref 0–40)
Albumin: 4.2 g/dL (ref 3.8–4.8)
Alkaline Phosphatase: 126 IU/L — AB (ref 44–121)
BUN/Creatinine Ratio: 14 (ref 12–28)
BUN: 17 mg/dL (ref 8–27)
Bilirubin Total: 0.4 mg/dL (ref 0.0–1.2)
CO2: 21 mmol/L (ref 20–29)
Calcium: 9.3 mg/dL (ref 8.7–10.3)
Chloride: 104 mmol/L (ref 96–106)
Creatinine, Ser: 1.19 mg/dL — AB (ref 0.57–1.00)
Globulin, Total: 2.4 g/dL (ref 1.5–4.5)
Glucose: 95 mg/dL (ref 70–99)
Potassium: 4.3 mmol/L (ref 3.5–5.2)
Sodium: 140 mmol/L (ref 134–144)
Total Protein: 6.6 g/dL (ref 6.0–8.5)
eGFR: 48 mL/min/1.73 — AB (ref >59)

## 2024-03-09 LAB — CBC WITH DIFFERENTIAL/PLATELET
Basophils Absolute: 0 x10E3/uL (ref 0.0–0.2)
Basos: 0 %
EOS (ABSOLUTE): 0.1 x10E3/uL (ref 0.0–0.4)
Eos: 2 %
Hematocrit: 38.5 % (ref 34.0–46.6)
Hemoglobin: 12.3 g/dL (ref 11.1–15.9)
Immature Grans (Abs): 0 x10E3/uL (ref 0.0–0.1)
Immature Granulocytes: 0 %
Lymphocytes Absolute: 1.4 x10E3/uL (ref 0.7–3.1)
Lymphs: 25 %
MCH: 30 pg (ref 26.6–33.0)
MCHC: 31.9 g/dL (ref 31.5–35.7)
MCV: 94 fL (ref 79–97)
Monocytes Absolute: 0.4 x10E3/uL (ref 0.1–0.9)
Monocytes: 7 %
Neutrophils Absolute: 3.6 x10E3/uL (ref 1.4–7.0)
Neutrophils: 66 %
Platelets: 197 x10E3/uL (ref 150–450)
RBC: 4.1 x10E6/uL (ref 3.77–5.28)
RDW: 12.7 % (ref 11.7–15.4)
WBC: 5.5 x10E3/uL (ref 3.4–10.8)

## 2024-03-09 LAB — THYROID PANEL WITH TSH
Free Thyroxine Index: 2.3 (ref 1.2–4.9)
T3 Uptake Ratio: 27 (ref 24–39)
T4, Total: 8.5 ug/dL (ref 4.5–12.0)
TSH: 3.33 u[IU]/mL (ref 0.450–4.500)

## 2024-03-10 NOTE — Progress Notes (Unsigned)
 Cardiology Office Note:  .   Date:  03/11/2024  ID:  Kimberly [name], DOB Apr 01, 1951, MRN [id number] PCP: [name], DO  [facility] Providers Cardiologist:  Darryle ONEIDA Decent, MD { History of Present Illness: .    Chief Complaint  Patient presents with   supraventricular tachycardia    Kimberly [name] is a 74 y.o. female with history of CKD 3a who presents for the evaluation of SVT, second degree AV block type 1 at the request of Jolinda Norene M, DO.   History of Present Illness   Kimberly [name] is a 73 year old female with chronic kidney disease stage 3A who presents for evaluation of supraventricular tachycardia (SVT). She is accompanied by her son, Kimberly [name]. She was referred by Rosina at [facility] for evaluation of SVT.  She experiences episodes of supraventricular tachycardia (SVT), with a recent monitor showing 96 episodes lasting up to 21 seconds. She occasionally feels her heart beating 'real hard' and experiences 'flutters' periodically, though she does not consistently notice these episodes. She describes occasional 'tinges of little pains' throughout the day, characterized as a 'little sharpness' in the chest, occurring several times a day without specific triggers. No dizziness or palpitations are noted during these episodes.  She initially sought medical attention after her Apple Watch indicated a heart rate below 50 while sitting in a recliner, despite being asymptomatic at that time. Her primary care provider prompted the use of a heart monitor after she reported her Apple Watch indicated a low heart rate. No dizziness or heart racing during these episodes.  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 Her kidney disease has not shown improvement, and she was previously on Benicar for kidney protection but discontinued it due to low  blood pressure readings. She is currently not on Benicar and is unsure about resuming it.  She takes Crestor  10 mg for cholesterol management, despite having a high HDL. She denies a history of heart attack, stroke, or diabetes. She does not smoke or consume alcohol. She attends water  aerobics three times a week and walks her dogs. No sleep apnea, but she reports feeling tired during the day, though she does not usually nap.       TSH 3.3     Problem List CKD 3a Hypothyroidism  SVT -96 episodes in 14 days; longest duration 21 sec -likely atrial tachycardia  4. PACs -1.9% burden  5. Second degree AV block, Mobitz 1 -during sleep     ROS: All other ROS reviewed and negative. Pertinent positives noted in the HPI.     Studies Reviewed: Kimberly [name]   EKG Interpretation Date/Time:  Friday March 11 2024 08:56:09 EDT Ventricular Rate:  65 PR Interval:  144 QRS Duration:  82 QT Interval:  396 QTC Calculation: 411 R Axis:   39  Text Interpretation: Normal sinus rhythm Normal ECG Confirmed by Decent Darryle [phone]) on 03/11/2024 8:58:59 AM   Zio 02/15/2024 Patient had a min HR of 26 bpm, max HR of 231 bpm, and avg HR of 71 bpm. Predominant underlying rhythm was Sinus Rhythm. 96 Supraventricular Tachycardia runs occurred, the run with the fastest interval lasting 16 beats with a max rate of 231 bpm, the longest lasting 20. 6 secs with an avg rate of 123 bpm. Second Degree AV Block- Mobitz I ( Wenckebach) was present. Isolated SVEs were occasional ( 1.  9% , 25919) , SVE Couplets were rare ( < 1. 0% , 724) , and SVE Triplets were rare ( < 1. 0% , 95) . Isolated VEs were rare ( < 1. 0% , 313) , VE Couplets were rare ( < 1. 0% , 1) , and VE Triplets were rare ( < 1. 0% , 1) . Physical Exam:   VS:  BP 124/61 (BP Location: Right Arm, Patient Position: Sitting, Cuff Size: Normal)   Pulse 86   Resp 16   Ht 5' 4 (1.626 m)   Wt 170 lb 9.6 oz (77.4 kg)   SpO2 97%   BMI 29.28 kg/m    Wt Readings from Last  3 Encounters:  03/11/24 170 lb 9.6 oz (77.4 kg)  03/08/24 172 lb (78 kg)  02/15/24 168 lb (76.2 kg)    GEN: Well nourished, well developed in no acute distress NECK: No JVD; No carotid bruits CARDIAC: RRR, no murmurs, rubs, gallops RESPIRATORY:  Clear to auscultation without rales, wheezing or rhonchi  ABDOMEN: Soft, non-tender, non-distended EXTREMITIES:  No edema; No deformity  ASSESSMENT AND PLAN: .   Assessment and Plan    Supraventricular tachycardia with intermittent chest pain Intermittent SVT episodes with chest pain, likely atrial tachycardia. Symptoms include fluttering and sharp chest pains. Slow heart rates at night are benign. Potential triggers include caffeine and poor sleep. Beta blockers like metoprolol considered but deferred. - Order echocardiogram. - Schedule stress PET scan to evaluate for CAD. Cannot undergo ETT due to exercise intolerance.  - Monitor symptoms using Apple Watch. - Advise moderation in caffeine consumption. - Encourage regular exercise, such as walking 15-20 minutes a day. - Consider sleep study if symptoms persist. - We will hold on metoprolol. Can consider if symptoms worsen.   Second degree AV block, Mobitz type I (Wenckebach) Second degree AV block type I observed overnight, considered benign.  Chronic kidney disease stage 3a CKD stage 3a with history of hydronephrosis and surgical intervention.  Hyperlipidemia Hyperlipidemia managed with Crestor  10 mg daily. Cholesterol levels not significantly elevated.          Informed Consent   Shared Decision Making/Informed Consent The risks [chest pain, shortness of breath, cardiac arrhythmias, dizziness, blood pressure fluctuations, myocardial infarction, stroke/transient ischemic attack, nausea, vomiting, allergic reaction, radiation exposure, metallic taste sensation and life-threatening complications (estimated to be 1 in 10,000)], benefits (risk stratification, diagnosing coronary artery  disease, treatment guidance) and alternatives of a cardiac PET stress test were discussed in detail with Kimberly [name] and she agrees to proceed.      Follow-up: Return in about 4 months (around 07/11/2024).   Signed, Darryle DASEN. Barbaraann, MD, [facility]  [facility]  [address] [place], [place] [address] [phone]  9:51 AM

## 2024-03-11 ENCOUNTER — Encounter: Payer: Self-pay | Admitting: Cardiovascular Disease

## 2024-03-11 ENCOUNTER — Ambulatory Visit: Attending: Cardiovascular Disease | Admitting: Cardiovascular Disease

## 2024-03-11 ENCOUNTER — Ambulatory Visit: Admitting: Cardiovascular Disease

## 2024-03-11 VITALS — BP 124/61 | HR 86 | Resp 16 | Ht 64.0 in | Wt 170.6 lb

## 2024-03-11 DIAGNOSIS — I441 Atrioventricular block, second degree: Secondary | ICD-10-CM | POA: Diagnosis not present

## 2024-03-11 DIAGNOSIS — I471 Supraventricular tachycardia, unspecified: Secondary | ICD-10-CM

## 2024-03-11 DIAGNOSIS — R072 Precordial pain: Secondary | ICD-10-CM

## 2024-03-11 NOTE — Patient Instructions (Addendum)
 Medication Instructions:  The current medical regimen is effective;  continue present plan and medications.  *If you need a refill on your cardiac medications before your next appointment, please call your pharmacy*  Lab Work: NONE If you have labs (blood work) drawn today and your tests are completely normal, you will receive your results only by: MyChart Message (if you have MyChart) OR A paper copy in the mail If you have any lab test that is abnormal or we need to change your treatment, we will call you to review the results.  Testing/Procedures: ECHOCARDIOGRAM Your physician has requested that you have an echocardiogram. Echocardiography is a painless test that uses sound waves to create images of your heart. It provides your doctor with information about the size and shape of your heart and how well your heart's chambers and valves are working. This procedure takes approximately one hour. There are no restrictions for this procedure. Please do NOT wear cologne, perfume, aftershave, or lotions (deodorant is allowed). Please arrive 15 minutes prior to your appointment time.  Please note: We ask at that you not bring children with you during ultrasound (echo/ vascular) testing. Due to room size and safety concerns, children are not allowed in the ultrasound rooms during exams. Our front office staff cannot provide observation of children in our lobby area while testing is being conducted. An adult accompanying a patient to their appointment will only be allowed in the ultrasound room at the discretion of the ultrasound technician under special circumstances. We apologize for any inconvenience.   Cardiac PET Stress Test   Follow-Up: At [facility], you and your health needs are our priority.  As part of our continuing mission to provide you with exceptional heart care, our providers are all part of one team.  This team includes your primary Cardiologist (physician) and Advanced  Practice Providers or APPs (Physician Assistants and Nurse Practitioners) who all work together to provide you with the care you need, when you need it.  Your next appointment:   4 months  Provider:   Darryle ONEIDA Decent, MD    We recommend signing up for the patient portal called MyChart.  Sign up information is provided on this After Visit Summary.  MyChart is used to connect with patients for Virtual Visits (Telemedicine).  Patients are able to view lab/test results, encounter notes, upcoming appointments, etc.  Non-urgent messages can be sent to your provider as well.   To learn more about what you can do with MyChart, go to [email or web address].   How to Prepare for Your Cardiac PET/CT Stress Test:  1. Please do not take these medications before your test:  ~Medications that may interfere with the cardiac pharmacological stress agent (ex. nitrates - including erectile dysfunction medications, isosorbide mononitrate, tamulosin or beta-blockers) the day of the exam. (Erectile dysfunction medication should be held for at least 72 hrs prior to test) ~Theophylline containing medications for 12 hours. ~Dipyridamole 48 hours prior to the test. ~Your remaining medications may be taken with water .  2. Nothing to eat or drink, except water , 3 hours prior to arrival time.   ~ NO caffeine/decaffeinated products, or chocolate 12 hours prior to arrival.  3. NO perfume, cologne or lotion on chest or abdomen area.         - FEMALES - Please avoid wearing dresses to this appointment.  4. Total time is 1 to 2 hours; you may want to bring reading material for the waiting time.  Please report  to Radiology at the [facility] Main Entrance 30 minutes early for your test. [address] [place], [place] [address]   In preparation for your appointment, medication and supplies will be purchased.  Appointment availability is limited, so if you need to cancel or reschedule, please call the  Radiology Department at [phone] [facility])  24 hours in advance to avoid a cancellation fee of $100.00  What to Expect After you Arrive:  Once you arrive and check in for your appointment, you will be taken to a preparation room within the Radiology Department.  A technologist or Nurse will obtain your medical history, verify that you are correctly prepped for the exam, and explain the procedure.  Afterwards,  an IV will be started in your arm and electrodes will be placed on your skin for EKG monitoring during the stress portion of the exam. Then you will be escorted to the PET/CT scanner.  There, staff will get you positioned on the scanner and obtain a blood pressure and EKG.  During the exam, you will continue to be connected to the EKG and blood pressure machines.  A small, safe amount of a radioactive tracer will be injected in your IV to obtain a series of pictures of your heart along with an injection of a stress agent.    After your Exam:  It is recommended that you eat a meal and drink a caffeinated beverage to counter act any effects of the stress agent.  Drink plenty of fluids for the remainder of the day and urinate frequently for the first couple of hours after the exam.  Your doctor will inform you of your test results within 7-10 business days.  For more information and frequently asked questions, please visit our website : [email or web address]  For questions about your test or how to prepare for your test, please call: Cardiac Imaging Nurse Navigators Office: [phone]

## 2024-03-13 LAB — ALKALINE PHOSPHATASE ISOENZYMES
Alkaline Phosphatase: 127 IU/L — ABNORMAL HIGH (ref 44–121)
BONE FRACTION %:: 37 %
Bone Fraction IU/L:: 47 IU/L (ref 18–57)
INTESTINAL FRAC.%:: 0 %
INTESTINALFRAC.IU/L:: 0 IU/L (ref 0–14)
LIVER FRACTION %:: 63 %
Liver Fraction IU/L:: 80 IU/L (ref 23–85)

## 2024-03-13 LAB — SPECIMEN STATUS REPORT
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
## 2024-03-16 ENCOUNTER — Encounter: Payer: Self-pay | Admitting: Family Medicine

## 2024-03-16 ENCOUNTER — Ambulatory Visit (INDEPENDENT_AMBULATORY_CARE_PROVIDER_SITE_OTHER): Admitting: Family Medicine

## 2024-03-16 VITALS — BP 122/80 | HR 72 | Ht 64.0 in | Wt 173.0 lb

## 2024-03-16 DIAGNOSIS — M9903 Segmental and somatic dysfunction of lumbar region: Secondary | ICD-10-CM | POA: Diagnosis not present

## 2024-03-16 DIAGNOSIS — M9904 Segmental and somatic dysfunction of sacral region: Secondary | ICD-10-CM

## 2024-03-16 DIAGNOSIS — G8929 Other chronic pain: Secondary | ICD-10-CM

## 2024-03-16 DIAGNOSIS — M9902 Segmental and somatic dysfunction of thoracic region: Secondary | ICD-10-CM | POA: Diagnosis not present

## 2024-03-16 DIAGNOSIS — M545 Low back pain, unspecified: Secondary | ICD-10-CM | POA: Diagnosis not present

## 2024-03-16 DIAGNOSIS — M9901 Segmental and somatic dysfunction of cervical region: Secondary | ICD-10-CM

## 2024-03-16 DIAGNOSIS — M9908 Segmental and somatic dysfunction of rib cage: Secondary | ICD-10-CM | POA: Diagnosis not present

## 2024-03-16 NOTE — Patient Instructions (Signed)
 Have your family look at medical scribe Keep being active See you again in 2 months

## 2024-03-16 NOTE — Assessment & Plan Note (Signed)
 Chronic spondylolisthesis of the lumbar spine noted.  Discussed icing regimen and home exercises, discussed which activities to do and which ones to avoid.  Increase activity slowly.  Discussed icing regimen.  Follow-up again in 6 to 8 weeks otherwise.  Has responded well to osteopathic manipulation for some of her discomfort and pains.

## 2024-03-17 ENCOUNTER — Encounter (HOSPITAL_COMMUNITY): Payer: Self-pay

## 2024-03-18 ENCOUNTER — Telehealth (HOSPITAL_COMMUNITY): Payer: Self-pay | Admitting: *Deleted

## 2024-03-18 ENCOUNTER — Ambulatory Visit (INDEPENDENT_AMBULATORY_CARE_PROVIDER_SITE_OTHER)

## 2024-03-18 VITALS — BP 122/80 | HR 72 | Ht 64.0 in | Wt 173.0 lb

## 2024-03-18 DIAGNOSIS — Z0000 Encounter for general adult medical examination without abnormal findings: Secondary | ICD-10-CM
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
## 2024-03-18 NOTE — Patient Instructions (Signed)
 [name] , Thank you for taking time out of your busy schedule to complete your Annual Wellness Visit with me. I enjoyed our conversation and look forward to speaking with you again next year. I, as well as your care team,  appreciate your ongoing commitment to your health goals. Please review the following plan we discussed and let me know if I can assist you in the future. Your Game plan/ To Do List    Referrals: If you haven't heard from the office you've been referred to, please reach out to them at the phone provided.   Follow up Visits: We will see or speak with you next year for your Next Medicare AWV with our clinical staff on 03/21/25 at 8:00a.m. Have you seen your provider in the last 6 months (3 months if uncontrolled diabetes)? Yes  Clinician Recommendations:  Aim for 30 minutes of exercise or brisk walking, 6-8 glasses of water , and 5 servings of fruits and vegetables each day.       This is a list of the screenings recommended for you:  Health Maintenance  Topic Date Due   COVID-19 Vaccine (3 - Moderna risk series) 11/09/2019   Cologuard (Stool DNA test)  03/12/2024   Medicare Annual Wellness Visit  03/17/2024   Flu Shot  10/18/2024*   Hepatitis C Screening  11/10/2024*   Mammogram  05/24/2024   DEXA scan (bone density measurement)  04/07/2025   DTaP/Tdap/Td vaccine (2 - Td or Tdap) 12/15/2032   Colon Cancer Screening  06/29/2033   Pneumococcal Vaccine for age over 55  Completed   Zoster (Shingles) Vaccine  Completed   HPV Vaccine  Aged Out   Meningitis B Vaccine  Aged Out  *Topic was postponed. The date shown is not the original due date.    Advanced directives: (In Chart) A copy of your advanced directives are scanned into your chart should your provider ever need it. Advance Care Planning is important because it:  [x]  Makes sure you receive the medical care that is consistent with your values, goals, and preferences  [x]  It provides guidance to your family and  loved ones and reduces their decisional burden about whether or not they are making the right decisions based on your wishes.  Follow the link provided in your after visit summary or read over the paperwork we have mailed to you to help you started getting your Advance Directives in place. If you need assistance in completing these, please reach out to us  so that we can help you!  See attachments for Preventive Care and Fall Prevention Tips.

## 2024-03-18 NOTE — Telephone Encounter (Signed)
 Attempted to call patient regarding upcoming cardiac PET appointment. Left message on voicemail with name and callback number  Larey Brick RN Navigator Cardiac Imaging [facility] [phone] Office [phone] Cell  Reminder to avoid caffeine 12 hours prior to her cardiac PET scan.

## 2024-03-22 ENCOUNTER — Ambulatory Visit: Payer: Self-pay | Admitting: Cardiovascular Disease

## 2024-03-22 ENCOUNTER — Ambulatory Visit (HOSPITAL_COMMUNITY)
Admission: RE | Admit: 2024-03-22 | Discharge: 2024-03-22 | Disposition: A | Discharge status: Home / Self Care | Source: Ambulatory Visit | Attending: Cardiovascular Disease | Admitting: Cardiovascular Disease

## 2024-03-22 DIAGNOSIS — R072 Precordial pain: Secondary | ICD-10-CM | POA: Insufficient documentation

## 2024-03-22 LAB — NM PET CT CARDIAC PERFUSION MULTI W/ABSOLUTE BLOODFLOW
MBFR: 2.79
Nuc Rest EF: 67 %
Nuc Stress EF: 73 %
Rest MBF: 1.12 ml/g/min
Rest Nuclear Isotope Dose: 20.2 mCi
ST Depression (mm): 0 mm
Stress MBF: 3.12 ml/g/min
Stress Nuclear Isotope Dose: 20.2 mCi
TID: 1.06

## 2024-03-22 MED ORDER — REGADENOSON 0.4 MG/5ML IV SOLN
INTRAVENOUS | Status: AC
  Filled 2024-03-22: qty 5

## 2024-03-22 MED ORDER — REGADENOSON 0.4 MG/5ML IV SOLN
0.4000 mg | Freq: Once | INTRAVENOUS | Status: AC
  Administered 2024-03-22: 0.4 mg via INTRAVENOUS

## 2024-03-22 MED ORDER — RUBIDIUM RB82 GENERATOR (RUBYFILL): 20.2000 | PACK | Freq: Once | INTRAVENOUS | Status: DC

## 2024-03-22 NOTE — Progress Notes (Signed)
 Pt. Tolerated lexi scan well.

## 2024-04-18 ENCOUNTER — Telehealth: Payer: Self-pay | Admitting: Cardiovascular Disease

## 2024-04-18 ENCOUNTER — Encounter: Payer: Self-pay | Admitting: Cardiovascular Disease

## 2024-04-18 NOTE — Telephone Encounter (Signed)
 Spoke with pt regarding her symptoms. Pt stated she had an episode today that lasted about an hour where she felt shaky, cold, weak and dizzy. The pt stated she also had a headache and felt nauseated. Pt stated she sat down on her recliner and took a Tylenol  and has since felt better. Pt denied any chest pain or shortness of breath. Pt also denied feeling any palpitations during the episode. Pt sent a strip from her [facility] on MyChart. Message will be forwarded to Dr. Barbaraann along with this message. Pt was given ED precautions. Pt verbalized understanding. All questions if any were answered.

## 2024-04-18 NOTE — Telephone Encounter (Signed)
 STAT if patient feels like he/she is going to faint   1. Are you feeling dizzy, lightheaded, or faint right now?   No  2. Have you passed out?    No   (If yes move to .[facility])  3. Do you have any other symptoms?   Weakness, shakiness  4. Have you checked your HR and BP (record if available)?   BP 119/65  HR 78  Patient is concerned she had an episode when she felt dizzy, weak, cold and shaky.  Patient stated she sat down in her recliner and since then she has been feeling better.  Patient noted she also took Tylenol .  Patient stated she will be sending a transmission in her MyChart.

## 2024-04-19 ENCOUNTER — Ambulatory Visit (HOSPITAL_COMMUNITY)
Admission: RE | Admit: 2024-04-19 | Discharge: 2024-04-19 | Disposition: A | Discharge status: Home / Self Care | Source: Ambulatory Visit | Attending: Internal Medicine | Admitting: Internal Medicine

## 2024-04-19 DIAGNOSIS — I471 Supraventricular tachycardia, unspecified: Secondary | ICD-10-CM | POA: Insufficient documentation

## 2024-04-19 DIAGNOSIS — N189 Chronic kidney disease, unspecified: Secondary | ICD-10-CM | POA: Insufficient documentation

## 2024-04-19 DIAGNOSIS — I081 Rheumatic disorders of both mitral and tricuspid valves: Secondary | ICD-10-CM | POA: Diagnosis not present

## 2024-04-19 LAB — ECHOCARDIOGRAM COMPLETE
Area-P 1/2: 4.15 cm2
S' Lateral: 3.4 cm

## 2024-04-25 ENCOUNTER — Ambulatory Visit: Payer: Self-pay

## 2024-04-25 ENCOUNTER — Encounter: Payer: Self-pay | Admitting: Cardiovascular Disease

## 2024-04-25 NOTE — Telephone Encounter (Signed)
 Patient states she is currently having fever 102.6, body chills earlier today. Does not feel sick and BP was normal. Req to speak with a nurse [phone]   Reason for Disposition . [1] Fever > 100 F (37.8 C) AND [2] foreign travel to a developing country in the last month  Answer Assessment - Initial Assessment Questions Pt went to water  aerobics this morning and came home to rest. After nap , Pt  woke up with chills/body aches. Pt took temp and had fever of 102.6. Pt has taken 2 tylenol  at 1500. I feel fine. I'm not sure what this is.     1. TEMPERATURE: What is the most recent temperature?  How was it measured?      102.6 2. ONSET: When did the fever start?      1500 today 3. CHILLS: Do you have chills? If yes: How bad are they?  (e.g., none, mild, moderate, severe)     yes 4. OTHER SYMPTOMS: Do you have any other symptoms besides the fever?  (e.g., abdomen pain, cough, diarrhea, earache, headache, sore throat, urination pain)     Body aches, headache 5. CAUSE: If there are no symptoms, ask: What do you think is causing the fever?      Not sure 6. CONTACTS: Does anyone else in the family have an infection?     denies 7. TREATMENT: What have you done so far to treat this fever? (e.g., OTC fever medicines)     Tylenol   8. IMMUNOCOMPROMISE: Do you have any of the following: diabetes, HIV positive, splenectomy, cancer chemotherapy, chronic steroid treatment, transplant patient, etc.?     denies  Protocols used: Fever-A-AH

## 2024-04-25 NOTE — Telephone Encounter (Signed)
 Patient is scheduled in office for tomorrow

## 2024-04-25 NOTE — Telephone Encounter (Signed)
 FYI Only or Action Required?: Action required by provider: request for appointment.  Patient was last seen in primary care on 03/08/2024 by Severa Rock HERO, FNP.  Called Nurse Triage reporting Fever.  Symptoms began today.  Interventions attempted: OTC medications: tylenol  .  Symptoms are: gradually worsening.  Triage Disposition: Call PCP Within 24 Hours  Patient/caregiver understands and will follow disposition?: Yes

## 2024-04-26 ENCOUNTER — Ambulatory Visit (INDEPENDENT_AMBULATORY_CARE_PROVIDER_SITE_OTHER): Admitting: Family Medicine

## 2024-04-26 ENCOUNTER — Encounter: Payer: Self-pay | Admitting: Family Medicine

## 2024-04-26 ENCOUNTER — Other Ambulatory Visit

## 2024-04-26 VITALS — BP 122/80 | HR 84 | Temp 97.8°F | Ht 64.0 in | Wt 171.1 lb

## 2024-04-26 DIAGNOSIS — R509 Fever, unspecified: Secondary | ICD-10-CM

## 2024-04-26 DIAGNOSIS — D631 Anemia in chronic kidney disease: Secondary | ICD-10-CM | POA: Diagnosis not present

## 2024-04-26 DIAGNOSIS — E211 Secondary hyperparathyroidism, not elsewhere classified: Secondary | ICD-10-CM | POA: Diagnosis not present

## 2024-04-26 DIAGNOSIS — R809 Proteinuria, unspecified: Secondary | ICD-10-CM | POA: Diagnosis not present

## 2024-04-26 DIAGNOSIS — N189 Chronic kidney disease, unspecified: Secondary | ICD-10-CM | POA: Diagnosis not present

## 2024-04-26 DIAGNOSIS — E119 Type 2 diabetes mellitus without complications: Secondary | ICD-10-CM | POA: Diagnosis not present

## 2024-04-26 LAB — VERITOR FLU A/B WAIVED
Influenza A: NEGATIVE
Influenza B: NEGATIVE
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
## 2024-04-27 ENCOUNTER — Ambulatory Visit: Payer: Self-pay | Admitting: Family Medicine

## 2024-04-27 LAB — CBC WITH DIFFERENTIAL/PLATELET
Basophils Absolute: 0.1 x10E3/uL (ref 0.0–0.2)
Basos: 2 %
EOS (ABSOLUTE): 0.1 x10E3/uL (ref 0.0–0.4)
Eos: 2 %
Hematocrit: 40.3 % (ref 34.0–46.6)
Hemoglobin: 13.2 g/dL (ref 11.1–15.9)
Immature Grans (Abs): 0.1 x10E3/uL (ref 0.0–0.1)
Immature Granulocytes: 2 %
Lymphocytes Absolute: 1.1 x10E3/uL (ref 0.7–3.1)
Lymphs: 31 %
MCH: 31.4 pg (ref 26.6–33.0)
MCHC: 32.8 g/dL (ref 31.5–35.7)
MCV: 96 fL (ref 79–97)
Monocytes Absolute: 0.3 x10E3/uL (ref 0.1–0.9)
Monocytes: 8 %
Neutrophils Absolute: 2 x10E3/uL (ref 1.4–7.0)
Neutrophils: 55 %
Platelets: 226 x10E3/uL (ref 150–450)
RBC: 4.21 x10E6/uL (ref 3.77–5.28)
RDW: 13.1 % (ref 11.7–15.4)
WBC: 3.4 x10E3/uL (ref 3.4–10.8)

## 2024-04-27 LAB — TSH+FREE T4
Free T4: 1.2 ng/dL (ref 0.82–1.77)
TSH: 2.25 u[IU]/mL (ref 0.450–4.500)

## 2024-04-27 LAB — NOVEL CORONAVIRUS, NAA: SARS-CoV-2, NAA: NOT DETECTED

## 2024-04-27 LAB — HIV ANTIBODY (ROUTINE TESTING W REFLEX): HIV Screen 4th Generation wRfx: NONREACTIVE
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
## 2024-05-19 ENCOUNTER — Encounter: Payer: Self-pay | Admitting: Family Medicine

## 2024-05-19 ENCOUNTER — Ambulatory Visit (INDEPENDENT_AMBULATORY_CARE_PROVIDER_SITE_OTHER): Admitting: Family Medicine

## 2024-05-19 VITALS — BP 112/82 | HR 65 | Ht 64.0 in | Wt 174.0 lb

## 2024-05-19 DIAGNOSIS — M9902 Segmental and somatic dysfunction of thoracic region: Secondary | ICD-10-CM | POA: Diagnosis not present

## 2024-05-19 DIAGNOSIS — M9908 Segmental and somatic dysfunction of rib cage: Secondary | ICD-10-CM | POA: Diagnosis not present

## 2024-05-19 DIAGNOSIS — M9903 Segmental and somatic dysfunction of lumbar region: Secondary | ICD-10-CM

## 2024-05-19 DIAGNOSIS — M4316 Spondylolisthesis, lumbar region: Secondary | ICD-10-CM

## 2024-05-19 DIAGNOSIS — M9904 Segmental and somatic dysfunction of sacral region: Secondary | ICD-10-CM

## 2024-05-19 DIAGNOSIS — M9901 Segmental and somatic dysfunction of cervical region: Secondary | ICD-10-CM

## 2024-05-19 NOTE — Patient Instructions (Signed)
 See me in 10-12 weeks Send report if it happens again

## 2024-05-19 NOTE — Assessment & Plan Note (Signed)
 Discussed with patient about icing regimen and home exercises, discussed with him to avoid.  Responded extremely well to muscle energy technique.  Follow-up again in 6 to 8 weeks.

## 2024-06-06 ENCOUNTER — Encounter: Payer: Self-pay | Admitting: Family Medicine

## 2024-06-06 ENCOUNTER — Ambulatory Visit (INDEPENDENT_AMBULATORY_CARE_PROVIDER_SITE_OTHER): Admitting: Family Medicine

## 2024-06-06 ENCOUNTER — Ambulatory Visit

## 2024-06-06 VITALS — BP 122/72 | HR 77 | Temp 97.3°F | Ht 64.0 in | Wt 171.2 lb

## 2024-06-06 DIAGNOSIS — G479 Sleep disorder, unspecified: Secondary | ICD-10-CM | POA: Diagnosis not present

## 2024-06-06 DIAGNOSIS — E7800 Pure hypercholesterolemia, unspecified: Secondary | ICD-10-CM

## 2024-06-06 DIAGNOSIS — Z23 Encounter for immunization: Secondary | ICD-10-CM

## 2024-06-06 DIAGNOSIS — E039 Hypothyroidism, unspecified: Secondary | ICD-10-CM | POA: Diagnosis not present

## 2024-06-06 DIAGNOSIS — N1831 Chronic kidney disease, stage 3a: Secondary | ICD-10-CM

## 2024-06-06 DIAGNOSIS — M810 Age-related osteoporosis without current pathological fracture: Secondary | ICD-10-CM

## 2024-06-06 DIAGNOSIS — Z0001 Encounter for general adult medical examination with abnormal findings: Secondary | ICD-10-CM

## 2024-06-06 DIAGNOSIS — Z0000 Encounter for general adult medical examination without abnormal findings: Secondary | ICD-10-CM

## 2024-06-06 DIAGNOSIS — E559 Vitamin D deficiency, unspecified: Secondary | ICD-10-CM

## 2024-06-06 DIAGNOSIS — Z780 Asymptomatic menopausal state: Secondary | ICD-10-CM

## 2024-06-06 MED ORDER — LEVOTHYROXINE SODIUM 25 MCG PO TABS
25.0000 ug | ORAL_TABLET | Freq: Every day | ORAL | 3 refills | Status: AC
Start: 2024-06-06 — End: ?

## 2024-06-06 MED ORDER — TRAZODONE HCL 50 MG PO TABS: 25.0000 mg | ORAL_TABLET | Freq: Every evening | ORAL | 3 refills | Status: AC | PRN

## 2024-06-06 MED ORDER — ROSUVASTATIN CALCIUM 10 MG PO TABS: 10.0000 mg | ORAL_TABLET | Freq: Every day | ORAL | 3 refills | Status: AC
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
## 2024-06-06 NOTE — Patient Instructions (Addendum)
 Bone density scan due Mammogram due Doxepin 3mg  not covered so I sent trazodone.  Ok to use 1/2 to 1 tablet as needed for sleep.  Insomnia Insomnia is a sleep disorder that makes it difficult to fall asleep or stay asleep. Insomnia can cause fatigue, low energy, difficulty concentrating, mood swings, and poor performance at work or school. There are three different ways to classify insomnia: Difficulty falling asleep. Difficulty staying asleep. Waking up too early in the morning. Any type of insomnia can be long-term (chronic) or short-term (acute). Both are common. Short-term insomnia usually lasts for 3 months or less. Chronic insomnia occurs at least three times a week for longer than 3 months. What are the causes? Insomnia may be caused by another condition, situation, or substance, such as: Having certain mental health conditions, such as anxiety and depression. Using caffeine, alcohol, tobacco, or drugs. Having gastrointestinal conditions, such as gastroesophageal reflux disease (GERD). Having certain medical conditions. These include: Asthma. Alzheimer's disease. Stroke. Chronic pain. An overactive thyroid  gland (hyperthyroidism). Other sleep disorders, such as restless legs syndrome and sleep apnea. Menopause. Sometimes, the cause of insomnia may not be known. What increases the risk? Risk factors for insomnia include: Gender. Females are affected more often than males. Age. Insomnia is more common as people get older. Stress and certain medical and mental health conditions. Lack of exercise. Having an irregular work schedule. This may include working night shifts and traveling between different time zones. What are the signs or symptoms? If you have insomnia, the main symptom is having trouble falling asleep or having trouble staying asleep. This may lead to other symptoms, such as: Feeling tired or having low energy. Feeling nervous about going to sleep. Not feeling  rested in the morning. Having trouble concentrating. Feeling irritable, anxious, or depressed. How is this diagnosed? This condition may be diagnosed based on: Your symptoms and medical history. Your health care provider may ask about: Your sleep habits. Any medical conditions you have. Your mental health. A physical exam. How is this treated? Treatment for insomnia depends on the cause. Treatment may focus on treating an underlying condition that is causing the insomnia. Treatment may also include: Medicines to help you sleep. Counseling or therapy. Lifestyle adjustments to help you sleep better. Follow these instructions at home: Eating and drinking  Limit or avoid alcohol, caffeinated beverages, and products that contain nicotine and tobacco, especially close to bedtime. These can disrupt your sleep. Do not eat a large meal or eat spicy foods right before bedtime. This can lead to digestive discomfort that can make it hard for you to sleep. Sleep habits  Keep a sleep diary to help you and your health care provider figure out what could be causing your insomnia. Write down: When you sleep. When you wake up during the night. How well you sleep and how rested you feel the next day. Any side effects of medicines you are taking. What you eat and drink. Make your bedroom a dark, comfortable place where it is easy to fall asleep. Put up shades or blackout curtains to block light from outside. Use a white noise machine to block noise. Keep the temperature cool. Limit screen use before bedtime. This includes: Not watching TV. Not using your smartphone, tablet, or computer. Stick to a routine that includes going to bed and waking up at the same times every day and night. This can help you fall asleep faster. Consider making a quiet activity, such as reading, part of your  nighttime routine. Try to avoid taking naps during the day so that you sleep better at night. Get out of bed if you  are still awake after 15 minutes of trying to sleep. Keep the lights down, but try reading or doing a quiet activity. When you feel sleepy, go back to bed. General instructions Take over-the-counter and prescription medicines only as told by your health care provider. Exercise regularly as told by your health care provider. However, avoid exercising in the hours right before bedtime. Use relaxation techniques to manage stress. Ask your health care provider to suggest some techniques that may work well for you. These may include: Breathing exercises. Routines to release muscle tension. Visualizing peaceful scenes. Make sure that you drive carefully. Do not drive if you feel very sleepy. Keep all follow-up visits. This is important. Contact a health care provider if: You are tired throughout the day. You have trouble in your daily routine due to sleepiness. You continue to have sleep problems, or your sleep problems get worse. Get help right away if: You have thoughts about hurting yourself or someone else. Get help right away if you feel like you may hurt yourself or others, or have thoughts about taking your own life. Go to your nearest emergency room or: Call 911. Call the National Suicide Prevention Lifeline at 506-316-2956 or 988. This is open 24 hours a day. Text the Crisis Text Line at 732-123-0970. Summary Insomnia is a sleep disorder that makes it difficult to fall asleep or stay asleep. Insomnia can be long-term (chronic) or short-term (acute). Treatment for insomnia depends on the cause. Treatment may focus on treating an underlying condition that is causing the insomnia. Keep a sleep diary to help you and your health care provider figure out what could be causing your insomnia. This information is not intended to replace advice given to you by your health care provider. Make sure you discuss any questions you have with your health care provider. Document Revised: 06/17/2021 Document  Reviewed: 06/17/2021 Elsevier Patient Education  2024 [employer].

## 2024-06-07 ENCOUNTER — Ambulatory Visit: Payer: Self-pay | Admitting: Family Medicine

## 2024-06-07 DIAGNOSIS — M810 Age-related osteoporosis without current pathological fracture: Secondary | ICD-10-CM

## 2024-06-07 LAB — CMP14+EGFR
ALT: 12 IU/L (ref 0–32)
AST: 21 IU/L (ref 0–40)
Albumin: 4.4 g/dL (ref 3.8–4.8)
Alkaline Phosphatase: 141 IU/L — ABNORMAL HIGH (ref 49–135)
BUN/Creatinine Ratio: 13 (ref 12–28)
BUN: 14 mg/dL (ref 8–27)
Bilirubin Total: 0.5 mg/dL (ref 0.0–1.2)
CO2: 22 mmol/L (ref 20–29)
Calcium: 9.4 mg/dL (ref 8.7–10.3)
Chloride: 106 mmol/L (ref 96–106)
Creatinine, Ser: 1.11 mg/dL — ABNORMAL HIGH (ref 0.57–1.00)
Globulin, Total: 2.3 g/dL (ref 1.5–4.5)
Glucose: 83 mg/dL (ref 70–99)
Potassium: 4.4 mmol/L (ref 3.5–5.2)
Sodium: 143 mmol/L (ref 134–144)
Total Protein: 6.7 g/dL (ref 6.0–8.5)
eGFR: 52 mL/min/1.73 — ABNORMAL LOW (ref >59)

## 2024-06-07 LAB — LIPID PANEL
Chol/HDL Ratio: 1.8 ratio (ref 0.0–4.4)
Cholesterol, Total: 199 mg/dL (ref 100–199)
HDL: 111 mg/dL (ref >39)
LDL Chol Calc (NIH): 74 mg/dL (ref 0–99)
Triglycerides: 77 mg/dL (ref 0–149)
VLDL Cholesterol Cal: 14 mg/dL (ref 5–40)

## 2024-06-07 LAB — VITAMIN D 25 HYDROXY (VIT D DEFICIENCY, FRACTURES): Vit D, 25-Hydroxy: 31.5 ng/mL (ref 30.0–100.0)

## 2024-06-08 ENCOUNTER — Encounter: Payer: Self-pay | Admitting: Family Medicine

## 2024-06-08 DIAGNOSIS — M810 Age-related osteoporosis without current pathological fracture: Secondary | ICD-10-CM | POA: Diagnosis not present

## 2024-06-08 DIAGNOSIS — Z780 Asymptomatic menopausal state: Secondary | ICD-10-CM | POA: Diagnosis not present

## 2024-06-09 ENCOUNTER — Telehealth: Payer: Self-pay

## 2024-06-09 NOTE — Progress Notes (Signed)
 Care Guide Pharmacy Note  06/09/2024 Name: Kimberly [name] MRN: [id number] DOB: 02-24-1951  Referred By: [name], DO Reason for referral: Complex Care Management (Outreach to schedule with Pharm d )   [name] is a 73 y.o. year old female who is a primary care patient of [name], DO.  [name] was referred to the pharmacist for assistance related to: osteoporosis   Successful contact was made with the patient to discuss pharmacy services including being ready for the pharmacist to call at least 5 minutes before the scheduled appointment time and to have medication bottles and any blood pressure readings ready for review. The patient agreed to meet with the pharmacist via telephone visit on (date/time).07/01/2024  Jeoffrey Buffalo , RMA     [place]  [facility]  Direct Dial: [phone]  Website: [email or web address]

## 2024-06-25 ENCOUNTER — Other Ambulatory Visit: Payer: Self-pay | Admitting: Gastroenterology

## 2024-06-27 NOTE — Progress Notes (Unsigned)
 Cardiology Office Note:  .   Date:  06/27/2024  ID:  Kimberly [name], DOB April 23, 1951, MRN [id number] PCP: [name], DO   [facility]Care Providers Cardiologist:  Darryle ONEIDA Decent, MD   History of Present Illness: .   No chief complaint on file.   Kimberly [name] is a 73 y.o. female with below history who presents for follow-up.   History of Present Illness              ***  Problem List CKD 3a Hypothyroidism  SVT -96 episodes in 14 days; longest duration 21 sec -likely atrial tachycardia  4. PACs -1.9% burden  5. Second degree AV block, Mobitz 1 -during sleep     ROS: All other ROS reviewed and negative. Pertinent positives noted in the HPI.     Studies Reviewed: SABRA        TTE 04/19/2024  1. Left ventricular ejection fraction, by estimation, is 50 to 55%. Left  ventricular ejection fraction by 3D volume is 55 %. The left ventricle has  low normal function. The left ventricle has no regional wall motion  abnormalities. Left ventricular  diastolic parameters are indeterminate. The average left ventricular  global longitudinal strain is -17.9 %. The global longitudinal strain is  normal.   2. Right ventricular systolic function is normal. The right ventricular  size is normal. There is normal pulmonary artery systolic pressure. The  estimated right ventricular systolic pressure is 31.6 mmHg.   3. The mitral valve is grossly normal. Mild mitral valve regurgitation.  No evidence of mitral stenosis.   4. Tricuspid valve regurgitation is mild to moderate.   5. The aortic valve is tricuspid. Aortic valve regurgitation is not  visualized. No aortic stenosis is present.   6. The inferior vena cava is dilated in size with >50% respiratory  variability, suggesting right atrial pressure of 8 mmHg.    NM Stress PET 03/22/2024   The study is normal. The study is low risk.   LV perfusion is normal. There is no evidence of ischemia. There is no evidence of  infarction.   Rest left ventricular function is normal. Rest EF: 67%. Stress left ventricular function is normal. Stress EF: 73%. End diastolic cavity size is normal. End systolic cavity size is normal.   Myocardial blood flow was computed to be 1.50ml/g/min at rest and 3.12ml/g/min at stress. Global myocardial blood flow reserve was 2.79 and was normal.   Coronary calcium  was absent on the attenuation correction CT images. Aortic atherosclerosis noted.   Electronically Signed  By: Stanly Leavens M.D.    Physical Exam:   VS:  There were no vitals taken for this visit.   Wt Readings from Last 3 Encounters:  06/06/24 171 lb 4 oz (77.7 kg)  05/19/24 174 lb (78.9 kg)  04/26/24 171 lb 2 oz (77.6 kg)    GEN: Well nourished, well developed in no acute distress NECK: No JVD; No carotid bruits CARDIAC: ***RRR, no murmurs, rubs, gallops RESPIRATORY:  Clear to auscultation without rales, wheezing or rhonchi  ABDOMEN: Soft, non-tender, non-distended EXTREMITIES:  No edema; No deformity  ASSESSMENT AND PLAN: .   Assessment and Plan                 {Are you ordering a CV Procedure (e.g. stress test, cath, DCCV, TEE, etc)?   Press F2        :[id number]}   Follow-up: No follow-ups on file.  Signed, Darryle ONEIDA. O'Neal,  MD, [facility]  [facility]  [address] [place], [place] [address] [phone]  4:24 PM

## 2024-07-01 ENCOUNTER — Other Ambulatory Visit

## 2024-07-01 DIAGNOSIS — M818 Other osteoporosis without current pathological fracture: Secondary | ICD-10-CM
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
## 2024-07-06 ENCOUNTER — Ambulatory Visit: Attending: Cardiovascular Disease | Admitting: Cardiovascular Disease

## 2024-07-06 ENCOUNTER — Encounter: Payer: Self-pay | Admitting: Cardiovascular Disease

## 2024-07-06 VITALS — BP 124/84 | HR 75 | Ht 64.0 in | Wt 174.8 lb

## 2024-07-06 DIAGNOSIS — I471 Supraventricular tachycardia, unspecified: Secondary | ICD-10-CM | POA: Diagnosis not present

## 2024-07-06 DIAGNOSIS — I441 Atrioventricular block, second degree: Secondary | ICD-10-CM | POA: Diagnosis not present

## 2024-07-06 NOTE — Patient Instructions (Signed)
 Medication Instructions:  Your physician recommends that you continue on your current medications as directed. Please refer to the Current Medication list given to you today.  *If you need a refill on your cardiac medications before your next appointment, please call your pharmacy*   Follow-Up: At [facility], you and your health needs are our priority.  As part of our continuing mission to provide you with exceptional heart care, our providers are all part of one team.  This team includes your primary Cardiologist (physician) and Advanced Practice Providers or APPs (Physician Assistants and Nurse Practitioners) who all work together to provide you with the care you need, when you need it.  Your next appointment:   12 month(s)  Provider:   Darryle ONEIDA Decent, MD

## 2024-07-18 ENCOUNTER — Encounter: Payer: Self-pay | Admitting: Pharmacist

## 2024-07-26 ENCOUNTER — Other Ambulatory Visit: Payer: Self-pay | Admitting: Gastroenterology

## 2024-07-28 ENCOUNTER — Other Ambulatory Visit (HOSPITAL_COMMUNITY): Payer: Self-pay | Admitting: Urology

## 2024-07-28 DIAGNOSIS — N1330 Unspecified hydronephrosis: Secondary | ICD-10-CM
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
## 2024-08-09 ENCOUNTER — Encounter: Payer: Self-pay | Admitting: Family Medicine

## 2024-08-09 ENCOUNTER — Ambulatory Visit: Admitting: Family Medicine

## 2024-08-09 VITALS — BP 124/76 | HR 73 | Ht 64.0 in | Wt 172.0 lb

## 2024-08-09 DIAGNOSIS — M9904 Segmental and somatic dysfunction of sacral region: Secondary | ICD-10-CM

## 2024-08-09 DIAGNOSIS — M9903 Segmental and somatic dysfunction of lumbar region: Secondary | ICD-10-CM

## 2024-08-09 DIAGNOSIS — M4316 Spondylolisthesis, lumbar region: Secondary | ICD-10-CM | POA: Diagnosis not present

## 2024-08-09 DIAGNOSIS — M9908 Segmental and somatic dysfunction of rib cage: Secondary | ICD-10-CM | POA: Diagnosis not present

## 2024-08-09 DIAGNOSIS — M9901 Segmental and somatic dysfunction of cervical region: Secondary | ICD-10-CM | POA: Diagnosis not present

## 2024-08-09 DIAGNOSIS — M9902 Segmental and somatic dysfunction of thoracic region: Secondary | ICD-10-CM | POA: Diagnosis not present

## 2024-08-09 NOTE — Assessment & Plan Note (Signed)
 Patient does have arthritic changes noted.  Discussed with patient about which activities to do and which ones to avoid.  Increase activity slowly.  Discussed icing regimen.  Increase home exercises routine on a regular basis and core strengthening.  No change in medication at the moment.  Follow-up again in 6 to 12 weeks.

## 2024-08-09 NOTE — Patient Instructions (Signed)
 Good to see you! Keep it boring See you again in 6-8 weeks

## 2024-08-16 ENCOUNTER — Ambulatory Visit (HOSPITAL_COMMUNITY)
Admission: RE | Admit: 2024-08-16 | Discharge: 2024-08-16 | Disposition: A | Discharge status: Home / Self Care | Source: Ambulatory Visit | Attending: Urology | Admitting: Urology

## 2024-08-16 DIAGNOSIS — N1330 Unspecified hydronephrosis: Secondary | ICD-10-CM | POA: Insufficient documentation

## 2024-08-16 MED ORDER — TECHNETIUM TC 99M MERTIATIDE
5.0000 | Freq: Once | INTRAVENOUS | Status: AC | PRN
  Administered 2024-08-16: 5.5 via INTRAVENOUS

## 2024-08-16 MED ORDER — FUROSEMIDE 10 MG/ML IJ SOLN
INTRAMUSCULAR | Status: AC
  Filled 2024-08-16: qty 4

## 2024-08-16 MED ORDER — FUROSEMIDE 10 MG/ML IJ SOLN
39.0000 mg | Freq: Once | INTRAMUSCULAR | Status: AC
  Administered 2024-08-16: 39 mg via INTRAVENOUS

## 2024-08-23 ENCOUNTER — Other Ambulatory Visit: Payer: Self-pay | Admitting: Gastroenterology

## 2024-10-06 ENCOUNTER — Ambulatory Visit: Admitting: Family Medicine

## 2025-03-21 ENCOUNTER — Ambulatory Visit

## 2025-03-22 ENCOUNTER — Ambulatory Visit
